# Patient Record
Sex: Male | Born: 1980 | Race: Black or African American | Hispanic: No | Marital: Single | Smoking: Current every day smoker
Health system: Southern US, Community
[De-identification: ages and names within clinical notes are randomized; demographics above are authoritative.]

## PROBLEM LIST (undated history)

## (undated) DIAGNOSIS — I319 Disease of pericardium, unspecified: Secondary | ICD-10-CM

## (undated) DIAGNOSIS — I10 Essential (primary) hypertension: Secondary | ICD-10-CM

## (undated) DIAGNOSIS — J189 Pneumonia, unspecified organism: Secondary | ICD-10-CM

## (undated) DIAGNOSIS — K219 Gastro-esophageal reflux disease without esophagitis: Secondary | ICD-10-CM

## (undated) DIAGNOSIS — J45909 Unspecified asthma, uncomplicated: Secondary | ICD-10-CM

## (undated) DIAGNOSIS — E119 Type 2 diabetes mellitus without complications: Secondary | ICD-10-CM

## (undated) HISTORY — PX: APPENDECTOMY: SHX54

## (undated) HISTORY — PX: TONSILLECTOMY: SUR1361

---

## 2016-09-28 ENCOUNTER — Encounter (HOSPITAL_COMMUNITY): Payer: Self-pay | Admitting: Vascular Surgery

## 2016-09-28 ENCOUNTER — Emergency Department (HOSPITAL_COMMUNITY)
Admission: EM | Admit: 2016-09-28 | Discharge: 2016-09-28 | Disposition: A | Discharge status: Home / Self Care | Payer: Self-pay | Attending: Dermatology | Admitting: Dermatology

## 2016-09-28 DIAGNOSIS — Z5321 Procedure and treatment not carried out due to patient leaving prior to being seen by health care provider: Secondary | ICD-10-CM | POA: Insufficient documentation

## 2016-09-28 DIAGNOSIS — R079 Chest pain, unspecified: Secondary | ICD-10-CM | POA: Insufficient documentation

## 2016-09-28 HISTORY — DX: Essential (primary) hypertension: I10

## 2016-09-28 HISTORY — DX: Disease of pericardium, unspecified: I31.9

## 2016-09-28 HISTORY — DX: Unspecified asthma, uncomplicated: J45.909
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
## 2016-09-28 NOTE — ED Notes (Addendum)
Pt refusing blood work at this time. RN encouraged patient to allow her to draw blood but pt continues to refuse

## 2017-11-30 ENCOUNTER — Encounter (HOSPITAL_COMMUNITY): Payer: Self-pay | Admitting: Emergency Medicine

## 2017-11-30 ENCOUNTER — Emergency Department (HOSPITAL_COMMUNITY): Payer: Self-pay

## 2017-11-30 ENCOUNTER — Other Ambulatory Visit: Payer: Self-pay

## 2017-11-30 ENCOUNTER — Emergency Department (HOSPITAL_COMMUNITY)
Admission: EM | Admit: 2017-11-30 | Discharge: 2017-11-30 | Disposition: A | Discharge status: Home / Self Care | Payer: Self-pay | Attending: Emergency Medicine | Admitting: Emergency Medicine

## 2017-11-30 DIAGNOSIS — R739 Hyperglycemia, unspecified: Secondary | ICD-10-CM

## 2017-11-30 DIAGNOSIS — F17200 Nicotine dependence, unspecified, uncomplicated: Secondary | ICD-10-CM | POA: Insufficient documentation

## 2017-11-30 DIAGNOSIS — J209 Acute bronchitis, unspecified: Secondary | ICD-10-CM | POA: Insufficient documentation

## 2017-11-30 DIAGNOSIS — I10 Essential (primary) hypertension: Secondary | ICD-10-CM | POA: Insufficient documentation

## 2017-11-30 LAB — BASIC METABOLIC PANEL
ANION GAP: 7 (ref 5–15)
BUN: 15 mg/dL (ref 6–20)
CO2: 26 mmol/L (ref 22–32)
Calcium: 8.3 mg/dL — ABNORMAL LOW (ref 8.9–10.3)
Chloride: 96 mmol/L — ABNORMAL LOW (ref 98–111)
Creatinine, Ser: 1.12 mg/dL (ref 0.61–1.24)
GLUCOSE: 511 mg/dL — AB (ref 70–99)
POTASSIUM: 3.8 mmol/L (ref 3.5–5.1)
Sodium: 129 mmol/L — ABNORMAL LOW (ref 135–145)

## 2017-11-30 LAB — GROUP A STREP BY PCR: Group A Strep by PCR: NOT DETECTED

## 2017-11-30 LAB — CBG MONITORING, ED
GLUCOSE-CAPILLARY: 287 mg/dL — AB (ref 70–99)
Glucose-Capillary: 540 mg/dL (ref 70–99)
Glucose-Capillary: >600 mg/dL (ref 70–99)

## 2017-11-30 MED ORDER — INSULIN ASPART 100 UNIT/ML ~~LOC~~ SOLN
5.0000 [IU] | Freq: Once | SUBCUTANEOUS | Status: AC
  Administered 2017-11-30: 5 [IU] via INTRAVENOUS
  Filled 2017-11-30: qty 1

## 2017-11-30 MED ORDER — ALBUTEROL SULFATE (2.5 MG/3ML) 0.083% IN NEBU: 5.0000 mg | INHALATION_SOLUTION | Freq: Once | RESPIRATORY_TRACT | Status: DC

## 2017-11-30 MED ORDER — AMOXICILLIN 500 MG PO CAPS: 500.0000 mg | ORAL_CAPSULE | Freq: Three times a day (TID) | ORAL | 0 refills | Status: DC

## 2017-11-30 MED ORDER — "INSULIN SYRINGE-NEEDLE U-100 28G X 1/2"" 1 ML MISC": 1.0000 | Freq: Two times a day (BID) | 0 refills | Status: DC

## 2017-11-30 MED ORDER — IPRATROPIUM-ALBUTEROL 0.5-2.5 (3) MG/3ML IN SOLN
RESPIRATORY_TRACT | Status: AC
  Filled 2017-11-30: qty 3

## 2017-11-30 MED ORDER — SODIUM CHLORIDE 0.9 % IV BOLUS
1000.0000 mL | Freq: Once | INTRAVENOUS | Status: AC
Start: 2017-11-30 — End: 2017-11-30
  Administered 2017-11-30: 1000 mL via INTRAVENOUS

## 2017-11-30 MED ORDER — IPRATROPIUM BROMIDE 0.02 % IN SOLN
0.5000 mg | Freq: Once | RESPIRATORY_TRACT | Status: DC
Start: 2017-11-30 — End: 2017-11-30

## 2017-11-30 MED ORDER — INSULIN ASPART 100 UNIT/ML ~~LOC~~ SOLN
SUBCUTANEOUS | Status: AC
  Filled 2017-11-30: qty 1

## 2017-11-30 MED ORDER — SODIUM CHLORIDE 0.9 % IV BOLUS
1000.0000 mL | Freq: Once | INTRAVENOUS | Status: AC
  Administered 2017-11-30: 1000 mL via INTRAVENOUS

## 2017-11-30 MED ORDER — INSULIN ASPART 100 UNIT/ML ~~LOC~~ SOLN
5.0000 [IU] | Freq: Once | SUBCUTANEOUS | Status: AC
  Administered 2017-11-30: 5 [IU] via INTRAVENOUS

## 2017-11-30 MED ORDER — ALBUTEROL SULFATE HFA 108 (90 BASE) MCG/ACT IN AERS
2.0000 | INHALATION_SPRAY | Freq: Four times a day (QID) | RESPIRATORY_TRACT | Status: DC | PRN
  Administered 2017-11-30: 2 via RESPIRATORY_TRACT
  Filled 2017-11-30: qty 6.7

## 2017-11-30 MED ORDER — INSULIN NPH ISOPHANE & REGULAR (70-30) 100 UNIT/ML ~~LOC~~ SUSP: 3.0000 [IU] | Freq: Two times a day (BID) | SUBCUTANEOUS | 0 refills | Status: AC

## 2017-11-30 MED ORDER — AEROCHAMBER Z-STAT PLUS/MEDIUM MISC
1.0000 | Freq: Once | Status: AC
  Administered 2017-11-30: 1
  Filled 2017-11-30: qty 1

## 2017-11-30 MED ORDER — ACETAMINOPHEN 500 MG PO TABS
1000.0000 mg | ORAL_TABLET | Freq: Once | ORAL | Status: AC
  Administered 2017-11-30: 1000 mg via ORAL
  Filled 2017-11-30: qty 2

## 2017-11-30 MED ORDER — IPRATROPIUM-ALBUTEROL 0.5-2.5 (3) MG/3ML IN SOLN
3.0000 mL | Freq: Once | RESPIRATORY_TRACT | Status: AC
  Administered 2017-11-30: 3 mL via RESPIRATORY_TRACT

## 2017-11-30 NOTE — ED Provider Notes (Signed)
[facility] EMERGENCY DEPARTMENT Provider Note   CSN: [id number] Arrival date & time: 11/30/17  0048  Time seen 01:35 AM   History   Chief Complaint Chief Complaint  Patient presents with  . Cough    HPI Jesse Lee is a 37 y.o. male.  HPI patient states 3 or 4 days ago he started with a sore throat and then he started getting a cough with dark green sputum production.  He states he feels short of breath and he has wheezing off and on.  He states he has had wheezing in the past and has used inhalers in the past but he does not have one currently to use.  He states yesterday morning his fever was 101.  He states he is having chills and hot flashes.  He denies rhinorrhea and states he did have nausea with one episode of vomiting on July 8.  No diarrhea.  He states he had a 60 pound weight loss in about 4 months due to his diabetes being out of control.  He states he has been on insulin for about a month since things seem to have stabilized however he ran out of his insulin 3 days ago.  PCP none, has first appt next week in [place]  Past Medical History:  Diagnosis Date  . Asthma   . Hypertension   . Pericarditis     There are no active problems to display for this patient.   Past Surgical History:  Procedure Laterality Date  . APPENDECTOMY    . TONSILLECTOMY          Home Medications     Insulin 70/30 3 units twice a day norvasc pravastatin  Prior to Admission medications   Medication Sig Start Date End Date Taking? Authorizing Provider  amoxicillin (AMOXIL) 500 MG capsule Take 1 capsule (500 mg total) by mouth 3 (three) times daily. 11/30/17   Rolland Porter, MD  insulin NPH-regular Human (NOVOLIN 70/30) (70-30) 100 UNIT/ML injection Inject 3 Units into the skin 2 (two) times daily with a meal. 11/30/17   Rolland Porter, MD  Insulin Syringe-Needle U-100 28G X 1/2" 1 ML MISC 1 Syringe by Does not apply route 2 (two) times daily. 11/30/17   Rolland Porter, MD    Family History No  family history on file.  Social History Social History   Tobacco Use  . Smoking status: Current Every Day Smoker  . Smokeless tobacco: Never Used  Substance Use Topics  . Alcohol use: Not on file  . Drug use: Not on file  employed   Allergies   Patient has no allergy information on record.   Review of Systems Review of Systems  All other systems reviewed and are negative.    Physical Exam Updated Vital Signs BP 111/67   Pulse 70   Temp 97.7 F (36.5 C) (Oral)   Resp 20   Wt 100.2 kg (221 lb)   SpO2 94%   Vital signs normal    Physical Exam  Constitutional: He is oriented to person, place, and time. He appears well-developed and well-nourished.  Non-toxic appearance. He does not appear ill. No distress.  HENT:  Head: Normocephalic and atraumatic.  Right Ear: External ear normal.  Left Ear: External ear normal.  Nose: Nose normal. No mucosal edema or rhinorrhea.  Mouth/Throat: Mucous membranes are normal. No dental abscesses or uvula swelling. Posterior oropharyngeal erythema present.  Patient's left ear canal is full of wax, I cannot see his TM.  Right  TM appears opaque without erythema.  The ear canal is relatively clean with minimal wax.  Eyes: Pupils are equal, round, and reactive to light. Conjunctivae and EOM are normal.  Neck: Normal range of motion and full passive range of motion without pain. Neck supple.  Cardiovascular: Normal rate, regular rhythm and normal heart sounds. Exam reveals no gallop and no friction rub.  No murmur heard. Pulmonary/Chest: Effort normal. No respiratory distress. He has decreased breath sounds. He has wheezes. He has no rhonchi. He has no rales. He exhibits no tenderness and no crepitus.  Abdominal: Soft. Normal appearance and bowel sounds are normal. He exhibits no distension. There is no tenderness. There is no rebound and no guarding.  Musculoskeletal: Normal range of motion. He exhibits no edema or tenderness.  Moves all  extremities well.   Lymphadenopathy:    He has cervical adenopathy.       Right cervical: Superficial cervical adenopathy present.       Left cervical: Superficial cervical adenopathy present.  Neurological: He is alert and oriented to person, place, and time. He has normal strength. No cranial nerve deficit.  Skin: Skin is warm, dry and intact. No rash noted. No erythema. No pallor.  Psychiatric: He has a normal mood and affect. His speech is normal and behavior is normal. His mood appears not anxious.  Nursing note and vitals reviewed.    ED Treatments / Results  Labs (all labs ordered are listed, but only abnormal results are displayed) Results for orders placed or performed during the hospital encounter of 11/30/17  Group A Strep by PCR  Result Value Ref Range   Group A Strep by PCR NOT DETECTED NOT DETECTED  Basic metabolic panel  Result Value Ref Range   Sodium 129 (L) 135 - 145 mmol/L   Potassium 3.8 3.5 - 5.1 mmol/L   Chloride 96 (L) 98 - 111 mmol/L   CO2 26 22 - 32 mmol/L   Glucose, Bld 511 (HH) 70 - 99 mg/dL   BUN 15 6 - 20 mg/dL   Creatinine, Ser 1.12 0.61 - 1.24 mg/dL   Calcium 8.3 (L) 8.9 - 10.3 mg/dL   GFR calc non Af Amer >60 >60 mL/min   GFR calc Af Amer >60 >60 mL/min   Anion gap 7 5 - 15  CBG monitoring, ED  Result Value Ref Range   Glucose-Capillary 540 (HH) 70 - 99 mg/dL   Comment 1 Notify RN   CBG monitoring, ED  Result Value Ref Range   Glucose-Capillary >600 (HH) 70 - 99 mg/dL  CBG monitoring, ED  Result Value Ref Range   Glucose-Capillary 287 (H) 70 - 99 mg/dL   Laboratory interpretation all normal    EKG None  Radiology Dg Chest 2 View  Result Date: 11/30/2017 CLINICAL DATA:  Cough and shortness of breath.  Chest pain. EXAM: CHEST - 2 VIEW COMPARISON:  None. FINDINGS: The cardiomediastinal contours are normal. The lungs are clear. Pulmonary vasculature is normal. No consolidation, pleural effusion, or pneumothorax. No acute osseous  abnormalities are seen. Remote right anterior rib fracture. IMPRESSION: Unremarkable radiographs of the chest. Electronically Signed   By: Jeb Levering M.D.   On: 11/30/2017 01:28    Procedures .Critical Care Performed by: Rolland Porter, MD Authorized by: Rolland Porter, MD   Critical care provider statement:    Critical care time (minutes):  31   Critical care was necessary to treat or prevent imminent or life-threatening deterioration of the following conditions:  Endocrine crisis   Critical care was time spent personally by me on the following activities:  Examination of patient, obtaining history from patient or surrogate, ordering and review of laboratory studies and re-evaluation of patient's condition   (including critical care time)  Medications Ordered in ED Medications  albuterol (PROVENTIL HFA;VENTOLIN HFA) 108 (90 Base) MCG/ACT inhaler 2 puff (has no administration in time range)  aerochamber Z-Stat Plus/medium 1 each (has no administration in time range)  ipratropium-albuterol (DUONEB) 0.5-2.5 (3) MG/3ML nebulizer solution 3 mL (3 mLs Nebulization Given 11/30/17 0210)  sodium chloride 0.9 % bolus 1,000 mL (0 mLs Intravenous Stopped 11/30/17 0325)  insulin aspart (novoLOG) injection 5 Units (5 Units Intravenous Given 11/30/17 0219)  acetaminophen (TYLENOL) tablet 1,000 mg (1,000 mg Oral Given 11/30/17 0247)  sodium chloride 0.9 % bolus 1,000 mL (1,000 mLs Intravenous New Bag/Given 11/30/17 0333)  insulin aspart (novoLOG) injection 5 Units (5 Units Intravenous Given 11/30/17 0333)     Initial Impression / Assessment and Plan / ED Course  I have reviewed the triage vital signs and the nursing notes.  Pertinent labs & imaging results that were available during my care of the patient were reviewed by me and considered in my medical decision making (see chart for details).    Patient was given albuterol nebulizer treatment for his diminished breath sounds and scattered  wheezing.   Patient CBG showed his blood sugar was over 540.  He had an IV inserted and he was given IV fluids and IV insulin 5 units.  Patient CBG was rechecked around 330 which is about 1 hour after he got the IV insulin.  His CBG now is reading greater than 600.  Patient states he did eat prior to coming to the ED.  I am still waiting for his be met to result he does not appear to be in DKA.  Patient's b met has resulted and he is hyperglycemic without metabolic acidosis.  Patient was given a second dose of IV insulin and another liter of fluid.  His CBG did improve to 287.  At this point he was felt stable for discharge.  Patient was given a prescription for insulin that he ran out of.  He was also given antibiotics because he is a smoker with bronchitis.  He was given an albuterol inhaler with a spacer to use for his bronchospasm.  He is encouraged to keep his first appointment with his new primary care doctor next week, he should return to the ED if he seems to be getting worse.   Final Clinical Impressions(s) / ED Diagnoses   Final diagnoses:  Hyperglycemia  Bronchitis with bronchospasm    ED Discharge Orders        Ordered    amoxicillin (AMOXIL) 500 MG capsule  3 times daily     11/30/17 0511    insulin NPH-regular Human (NOVOLIN 70/30) (70-30) 100 UNIT/ML injection  2 times daily with meals     11/30/17 0514    Insulin Syringe-Needle U-100 28G X 1/2" 1 ML MISC  2 times daily     11/30/17 0514      Plan discharge  Rolland Porter, MD, Barbette Or, MD 11/30/17 [phone]

## 2017-11-30 NOTE — Discharge Instructions (Addendum)
Keep your appointment next week with your new doctor. Restart your insulin twice a day. Look at the diet information. Take the antibiotic until gone. Use the inhaler as needed for wheezing or shortness of breath. Take mucinex DM OTC for cough. Recheck if you get a fever, struggle to breathe or seem worse. Try to stop smoking!!!

## 2017-11-30 NOTE — ED Triage Notes (Signed)
Pt C/O cough and SOB that started 2 days ago. Pt also states his throat has been sore.

## 2017-11-30 NOTE — ED Notes (Signed)
Pt given urinal.

## 2017-11-30 NOTE — ED Notes (Signed)
Critical result. BS 511. MD Lynelle DoctorKnapp notified.

## 2018-09-26 ENCOUNTER — Other Ambulatory Visit: Payer: Self-pay

## 2018-09-26 ENCOUNTER — Emergency Department (HOSPITAL_COMMUNITY): Payer: Self-pay

## 2018-09-26 ENCOUNTER — Emergency Department (HOSPITAL_COMMUNITY)
Admission: EM | Admit: 2018-09-26 | Discharge: 2018-09-26 | Disposition: A | Discharge status: Home / Self Care | Payer: Self-pay | Attending: Emergency Medicine | Admitting: Emergency Medicine

## 2018-09-26 DIAGNOSIS — F17200 Nicotine dependence, unspecified, uncomplicated: Secondary | ICD-10-CM | POA: Insufficient documentation

## 2018-09-26 DIAGNOSIS — Z794 Long term (current) use of insulin: Secondary | ICD-10-CM | POA: Insufficient documentation

## 2018-09-26 DIAGNOSIS — K5900 Constipation, unspecified: Secondary | ICD-10-CM | POA: Insufficient documentation

## 2018-09-26 DIAGNOSIS — R1011 Right upper quadrant pain: Secondary | ICD-10-CM

## 2018-09-26 DIAGNOSIS — R739 Hyperglycemia, unspecified: Secondary | ICD-10-CM | POA: Insufficient documentation

## 2018-09-26 DIAGNOSIS — I10 Essential (primary) hypertension: Secondary | ICD-10-CM | POA: Insufficient documentation

## 2018-09-26 DIAGNOSIS — J45909 Unspecified asthma, uncomplicated: Secondary | ICD-10-CM | POA: Insufficient documentation

## 2018-09-26 DIAGNOSIS — F329 Major depressive disorder, single episode, unspecified: Secondary | ICD-10-CM | POA: Insufficient documentation

## 2018-09-26 LAB — COMPREHENSIVE METABOLIC PANEL
ALT: 15 U/L (ref 0–44)
AST: 29 U/L (ref 15–41)
Albumin: 4.3 g/dL (ref 3.5–5.0)
Alkaline Phosphatase: 73 U/L (ref 38–126)
Anion gap: 11 (ref 5–15)
BUN: 16 mg/dL (ref 6–20)
CO2: 21 mmol/L — ABNORMAL LOW (ref 22–32)
Calcium: 9.1 mg/dL (ref 8.9–10.3)
Chloride: 100 mmol/L (ref 98–111)
Creatinine, Ser: 1.16 mg/dL (ref 0.61–1.24)
GFR calc Af Amer: >60 mL/min (ref >60)
GFR calc non Af Amer: >60 mL/min (ref >60)
Glucose, Bld: 387 mg/dL — ABNORMAL HIGH (ref 70–99)
Potassium: 4.9 mmol/L (ref 3.5–5.1)
Sodium: 132 mmol/L — ABNORMAL LOW (ref 135–145)
Total Bilirubin: 1.3 mg/dL — ABNORMAL HIGH (ref 0.3–1.2)
Total Protein: 7.4 g/dL (ref 6.5–8.1)

## 2018-09-26 LAB — CBC WITH DIFFERENTIAL/PLATELET
Abs Immature Granulocytes: 0.02 10*3/uL (ref 0.00–0.07)
Basophils Absolute: 0 10*3/uL (ref 0.0–0.1)
Basophils Relative: 0 %
Eosinophils Absolute: 0.2 10*3/uL (ref 0.0–0.5)
Eosinophils Relative: 2 %
HCT: 50.5 % (ref 39.0–52.0)
Hemoglobin: 18.2 g/dL — ABNORMAL HIGH (ref 13.0–17.0)
Immature Granulocytes: 0 %
Lymphocytes Relative: 20 %
Lymphs Abs: 1.4 10*3/uL (ref 0.7–4.0)
MCH: 31.9 pg (ref 26.0–34.0)
MCHC: 36 g/dL (ref 30.0–36.0)
MCV: 88.4 fL (ref 80.0–100.0)
Monocytes Absolute: 0.5 10*3/uL (ref 0.1–1.0)
Monocytes Relative: 7 %
Neutro Abs: 5.1 10*3/uL (ref 1.7–7.7)
Neutrophils Relative %: 71 %
Platelets: 252 10*3/uL (ref 150–400)
RBC: 5.71 MIL/uL (ref 4.22–5.81)
RDW: 12.7 % (ref 11.5–15.5)
WBC: 7.3 10*3/uL (ref 4.0–10.5)
nRBC: 0 % (ref 0.0–0.2)

## 2018-09-26 LAB — URINALYSIS, ROUTINE W REFLEX MICROSCOPIC
Bacteria, UA: NONE SEEN
Bilirubin Urine: NEGATIVE
Glucose, UA: >=500 mg/dL — AB
Ketones, ur: 20 mg/dL — AB
Leukocytes,Ua: NEGATIVE
Nitrite: NEGATIVE
Protein, ur: NEGATIVE mg/dL
Specific Gravity, Urine: 1.03 (ref 1.005–1.030)
pH: 5 (ref 5.0–8.0)

## 2018-09-26 LAB — LIPASE, BLOOD: Lipase: 51 U/L (ref 11–51)

## 2018-09-26 LAB — CBG MONITORING, ED
Glucose-Capillary: 381 mg/dL — ABNORMAL HIGH (ref 70–99)
Glucose-Capillary: 300 mg/dL — ABNORMAL HIGH (ref 70–99)

## 2018-09-26 MED ORDER — SODIUM CHLORIDE 0.9 % IV BOLUS
1000.0000 mL | Freq: Once | INTRAVENOUS | Status: AC
  Administered 2018-09-26: 1000 mL via INTRAVENOUS

## 2018-09-26 MED ORDER — MORPHINE SULFATE (PF) 4 MG/ML IV SOLN
4.0000 mg | Freq: Once | INTRAVENOUS | Status: AC
  Administered 2018-09-26: 4 mg via INTRAVENOUS
  Filled 2018-09-26: qty 1

## 2018-09-26 MED ORDER — POLYETHYLENE GLYCOL 3350 17 G PO PACK: 17.0000 g | PACK | Freq: Every day | ORAL | 0 refills | Status: DC

## 2018-09-26 MED ORDER — FAMOTIDINE 20 MG PO TABS: 20.0000 mg | ORAL_TABLET | Freq: Two times a day (BID) | ORAL | 0 refills | Status: DC

## 2018-09-26 MED ORDER — ONDANSETRON HCL 4 MG/2ML IJ SOLN
4.0000 mg | Freq: Once | INTRAMUSCULAR | Status: AC
  Administered 2018-09-26: 4 mg via INTRAVENOUS
  Filled 2018-09-26: qty 2

## 2018-09-26 NOTE — ED Notes (Signed)
Patient transported to Ultrasound 

## 2018-09-26 NOTE — ED Triage Notes (Signed)
Pt here for evaluation of abdominal pain since yesterday. Denies N/V/D. Tender to touch.

## 2018-09-26 NOTE — Discharge Instructions (Addendum)
Please take miralax and pepcid as directed.   Please utilize counseling resources provided.  Please follow up with your primary care provider within 5-7 days for re-evaluation of your symptoms.   Please return to the ER sooner if you have any new or worsening symptoms, or if you have any of the following symptoms:  Abdominal pain that does not go away.  You have a fever.  You keep throwing up (vomiting).  The pain is felt only in portions of the abdomen. Pain in the right side could possibly be appendicitis. In an adult, pain in the left lower portion of the abdomen could be colitis or diverticulitis.  You pass bloody or black tarry stools.  There is bright red blood in the stool.  The constipation stays for more than 4 days.  There is belly (abdominal) or rectal pain.  You do not seem to be getting better.  You have any questions or concerns.
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
## 2018-11-28 ENCOUNTER — Other Ambulatory Visit: Payer: Self-pay | Admitting: Neurological Surgery

## 2018-12-04 NOTE — Pre-Procedure Instructions (Signed)
[place] ([address] [place] [address] [place]) [place] [address] Phone: [phone] Fax: [phone]      Your procedure is scheduled on  12-08-18 friday  Report to [facility] Main Entrance "A" at  0745A.M., and check in at the Admitting office.  Call this number if you have problems the morning of surgery:  [phone]  Call [phone] if you have any questions prior to your surgery date Monday-Friday 8am-4pm    Remember:  Do not eat or drink after midnight the night before your surgery   Take these medicines the morning of surgery with A SIP OF WATER :none  7 days prior to surgery STOP taking any Aspirin (unless otherwise instructed by your surgeon), Aleve, Naproxen, Ibuprofen, Motrin, Advil, Goody's, BC's, all herbal medications, fish oil, and all vitamins.    WHAT DO I DO ABOUT MY DIABETES MEDICATION?   . THE DAY BEFORE SURGERY, take ______20_____ units of Insulin NPH-regular Human(NOVOLIN) for your morning dose. Take 10 units, for your evening dose.      . THE MORNING OF SURGERY, take ____10_________ units of Insulin NPH-regular Human(NOVOLIN) .    How to Manage Your Diabetes Before and After Surgery  Why is it important to control my blood sugar before and after surgery? . Improving blood sugar levels before and after surgery helps healing and can limit problems. . A way of improving blood sugar control is eating a healthy diet by: o  Eating less sugar and carbohydrates o  Increasing activity/exercise o  Talking with your doctor about reaching your blood sugar goals . High blood sugars (greater than 180 mg/dL) can raise your risk of infections and slow your recovery, so you will need to focus on controlling your diabetes during the weeks before surgery. . Make sure that the doctor who takes care of your diabetes knows about your planned surgery including the date and location.  How do I manage my blood sugar  before surgery? . Check your blood sugar at least 4 times a day, starting 2 days before surgery, to make sure that the level is not too high or low. o Check your blood sugar the morning of your surgery when you wake up and every 2 hours until you get to the Short Stay unit. . If your blood sugar is less than 70 mg/dL, you will need to treat for low blood sugar: o Do not take insulin. o Treat a low blood sugar (less than 70 mg/dL) with  cup of clear juice (cranberry or apple), 4 glucose tablets, OR glucose gel. o Recheck blood sugar in 15 minutes after treatment (to make sure it is greater than 70 mg/dL). If your blood sugar is not greater than 70 mg/dL on recheck, call [phone] for further instructions. . Report your blood sugar to the short stay nurse when you get to Short Stay.  . If you are admitted to the hospital after surgery: o Your blood sugar will be checked by the staff and you will probably be given insulin after surgery (instead of oral diabetes medicines) to make sure you have good blood sugar levels. o The goal for blood sugar control after surgery is 80-180 mg/dL.   The Morning of Surgery  Do not wear jewelry.  Do not wear lotions, powders, or colognes.    Men may shave face and neck.  Do not bring valuables to the hospital.  [facility] is not responsible for any  belongings or valuables.  If you are a smoker, DO NOT Smoke 24 hours prior to surgery IF you wear a CPAP at night please bring your mask, tubing, and machine the morning of surgery   Remember that you must have someone to transport you home after your surgery, and remain with you for 24 hours if you are discharged the same day.   Contacts, glasses, hearing aids, dentures or bridgework may not be worn into surgery.    Leave your suitcase in the car.  After surgery it may be brought to your room.  For patients admitted to the hospital, discharge time will be determined by your treatment team.  Patients  discharged the day of surgery will not be allowed to drive home.    Special instructions:   [facility]- Preparing For Surgery  Before surgery, you can play an important role. Because skin is not sterile, your skin needs to be as free of germs as possible. You can reduce the number of germs on your skin by washing with CHG (chlorahexidine gluconate) Soap before surgery.  CHG is an antiseptic cleaner which kills germs and bonds with the skin to continue killing germs even after washing.    Oral Hygiene is also important to reduce your risk of infection.  Remember - BRUSH YOUR TEETH THE MORNING OF SURGERY WITH YOUR REGULAR TOOTHPASTE  Please do not use if you have an allergy to CHG or antibacterial soaps. If your skin becomes reddened/irritated stop using the CHG.  Do not shave (including legs and underarms) for at least 48 hours prior to first CHG shower. It is OK to shave your face.  Please follow these instructions carefully.   1. Shower the NIGHT BEFORE SURGERY and the MORNING OF SURGERY with CHG Soap.   2. If you chose to wash your hair, wash your hair first as usual with your normal shampoo.  3. After you shampoo, rinse your hair and body thoroughly to remove the shampoo.  4. Use CHG as you would any other liquid soap. You can apply CHG directly to the skin and wash gently with a scrungie or a clean washcloth.   5. Apply the CHG Soap to your body ONLY FROM THE NECK DOWN.  Do not use on open wounds or open sores. Avoid contact with your eyes, ears, mouth and genitals (private parts). Wash Face and genitals (private parts)  with your normal soap.   6. Wash thoroughly, paying special attention to the area where your surgery will be performed.  7. Thoroughly rinse your body with warm water from the neck down.  8. DO NOT shower/wash with your normal soap after using and rinsing off the CHG Soap.  9. Pat yourself dry with a CLEAN TOWEL.  10. Wear CLEAN PAJAMAS to bed the night before  surgery, wear comfortable clothes the morning of surgery  11. Place CLEAN SHEETS on your bed the night of your first shower and DO NOT SLEEP WITH PETS.  Day of Surgery:  Do not apply any deodorants/lotions. Please shower the morning of surgery with the CHG soap  Please wear clean clothes to the hospital/surgery center.   Remember to brush your teeth WITH YOUR REGULAR TOOTHPASTE.   Please read over the fact sheets that you were given.

## 2018-12-05 ENCOUNTER — Encounter (HOSPITAL_COMMUNITY)
Admission: RE | Admit: 2018-12-05 | Discharge: 2018-12-05 | Disposition: A | Discharge status: Home / Self Care | Payer: Self-pay | Source: Ambulatory Visit | Attending: Neurological Surgery | Admitting: Neurological Surgery

## 2018-12-05 ENCOUNTER — Other Ambulatory Visit (HOSPITAL_COMMUNITY)
Admission: RE | Admit: 2018-12-05 | Discharge: 2018-12-05 | Disposition: A | Discharge status: Home / Self Care | Payer: HRSA Program | Source: Ambulatory Visit | Attending: Neurological Surgery | Admitting: Neurological Surgery

## 2018-12-05 ENCOUNTER — Encounter (HOSPITAL_COMMUNITY): Payer: Self-pay

## 2018-12-05 ENCOUNTER — Other Ambulatory Visit: Payer: Self-pay

## 2018-12-05 ENCOUNTER — Ambulatory Visit (HOSPITAL_COMMUNITY)
Admission: RE | Admit: 2018-12-05 | Discharge: 2018-12-05 | Disposition: A | Discharge status: Home / Self Care | Payer: Self-pay | Source: Ambulatory Visit | Attending: Neurological Surgery | Admitting: Neurological Surgery

## 2018-12-05 DIAGNOSIS — M5126 Other intervertebral disc displacement, lumbar region: Secondary | ICD-10-CM

## 2018-12-05 DIAGNOSIS — Z1159 Encounter for screening for other viral diseases: Secondary | ICD-10-CM | POA: Diagnosis not present

## 2018-12-05 HISTORY — DX: Type 2 diabetes mellitus without complications: E11.9

## 2018-12-05 HISTORY — DX: Gastro-esophageal reflux disease without esophagitis: K21.9

## 2018-12-05 HISTORY — DX: Pneumonia, unspecified organism: J18.9

## 2018-12-05 LAB — HEMOGLOBIN A1C
Hgb A1c MFr Bld: 11.1 % — ABNORMAL HIGH (ref 4.8–5.6)
Mean Plasma Glucose: 271.87 mg/dL

## 2018-12-05 LAB — COMPREHENSIVE METABOLIC PANEL
ALT: 18 U/L (ref 0–44)
AST: 17 U/L (ref 15–41)
Albumin: 4.4 g/dL (ref 3.5–5.0)
Alkaline Phosphatase: 65 U/L (ref 38–126)
Anion gap: 11 (ref 5–15)
BUN: 11 mg/dL (ref 6–20)
CO2: 23 mmol/L (ref 22–32)
Calcium: 9.6 mg/dL (ref 8.9–10.3)
Chloride: 101 mmol/L (ref 98–111)
Creatinine, Ser: 1.1 mg/dL (ref 0.61–1.24)
GFR calc Af Amer: >60 mL/min (ref >60)
GFR calc non Af Amer: >60 mL/min (ref >60)
Glucose, Bld: 367 mg/dL — ABNORMAL HIGH (ref 70–99)
Potassium: 4.3 mmol/L (ref 3.5–5.1)
Sodium: 135 mmol/L (ref 135–145)
Total Bilirubin: 0.8 mg/dL (ref 0.3–1.2)
Total Protein: 7.7 g/dL (ref 6.5–8.1)

## 2018-12-05 LAB — CBC WITH DIFFERENTIAL/PLATELET
Abs Immature Granulocytes: 0.03 10*3/uL (ref 0.00–0.07)
Basophils Absolute: 0 10*3/uL (ref 0.0–0.1)
Basophils Relative: 0 %
Eosinophils Absolute: 0.1 10*3/uL (ref 0.0–0.5)
Eosinophils Relative: 1 %
HCT: 49.2 % (ref 39.0–52.0)
Hemoglobin: 17.2 g/dL — ABNORMAL HIGH (ref 13.0–17.0)
Immature Granulocytes: 0 %
Lymphocytes Relative: 15 %
Lymphs Abs: 1.3 10*3/uL (ref 0.7–4.0)
MCH: 31.2 pg (ref 26.0–34.0)
MCHC: 35 g/dL (ref 30.0–36.0)
MCV: 89.1 fL (ref 80.0–100.0)
Monocytes Absolute: 0.6 10*3/uL (ref 0.1–1.0)
Monocytes Relative: 7 %
Neutro Abs: 6.5 10*3/uL (ref 1.7–7.7)
Neutrophils Relative %: 77 %
Platelets: 196 10*3/uL (ref 150–400)
RBC: 5.52 MIL/uL (ref 4.22–5.81)
RDW: 12.4 % (ref 11.5–15.5)
WBC: 8.5 10*3/uL (ref 4.0–10.5)
nRBC: 0 % (ref 0.0–0.2)

## 2018-12-05 LAB — PROTIME-INR
INR: 1 (ref 0.8–1.2)
Prothrombin Time: 12.6 seconds (ref 11.4–15.2)

## 2018-12-05 LAB — GLUCOSE, CAPILLARY: Glucose-Capillary: 320 mg/dL — ABNORMAL HIGH (ref 70–99)

## 2018-12-05 LAB — SURGICAL PCR SCREEN
MRSA, PCR: NEGATIVE
Staphylococcus aureus: NEGATIVE

## 2018-12-05 LAB — SARS CORONAVIRUS 2 (TAT 6-24 HRS): SARS Coronavirus 2: NEGATIVE

## 2018-12-05 NOTE — Progress Notes (Signed)
PCP - [facility] in [facility]  Chest x-ray - 12-05-18 EKG - 12-05-18  DM - type 2 Fasting Blood Sugar - 220s CBG at PAT appointment - 320 Pt stated he did not take insulin this morning.  James aware.   Anesthesia review: yes Pt stated he slept under a fan a few nights ago, and has since had chest pain, burning throat, and cough with phlem.  At PAT appointment, patient denies any symptoms.  Jeneen Rinks notified and visited with patient at PAT appointment.   Patient denies shortness of breath, fever, cough and chest pain at PAT appointment  Patient verbalized understanding of instructions that were given to them at the PAT appointment. Patient was also instructed that they will need to review over the PAT instructions again at home before surgery.

## 2018-12-08 ENCOUNTER — Other Ambulatory Visit (HOSPITAL_COMMUNITY)
Admission: RE | Admit: 2018-12-08 | Discharge: 2018-12-08 | Disposition: A | Discharge status: Home / Self Care | Payer: HRSA Program | Source: Ambulatory Visit | Attending: Neurological Surgery | Admitting: Neurological Surgery

## 2018-12-08 DIAGNOSIS — Z1159 Encounter for screening for other viral diseases: Secondary | ICD-10-CM | POA: Insufficient documentation

## 2018-12-08 LAB — SARS CORONAVIRUS 2 (TAT 6-24 HRS): SARS Coronavirus 2: NEGATIVE

## 2018-12-08 NOTE — Anesthesia Preprocedure Evaluation (Addendum)
Anesthesia Evaluation  Patient identified by MRN, date of birth, ID band Patient awake    Reviewed: Allergy & Precautions, H&P , NPO status , Patient's Chart, lab work & pertinent test results  Airway Mallampati: II  TM Distance: >3 FB Neck ROM: Full    Dental no notable dental hx. (+) Teeth Intact, Dental Advisory Given   Pulmonary asthma , Current Smoker,    Pulmonary exam normal breath sounds clear to auscultation       Cardiovascular hypertension,  Rhythm:Regular Rate:Normal     Neuro/Psych negative neurological ROS  negative psych ROS   GI/Hepatic Neg liver ROS, GERD  ,  Endo/Other  diabetes, Poorly Controlled, Type 1, Insulin Dependent  Renal/GU negative Renal ROS  negative genitourinary   Musculoskeletal   Abdominal   Peds  Hematology negative hematology ROS (+)   Anesthesia Other Findings   Reproductive/Obstetrics negative OB ROS                           Anesthesia Physical Anesthesia Plan  ASA: III  Anesthesia Plan: General   Post-op Pain Management:    Induction: Intravenous  PONV Risk Score and Plan: 2 and Ondansetron and Midazolam  Airway Management Planned: Oral ETT  Additional Equipment:   Intra-op Plan:   Post-operative Plan: Extubation in OR  Informed Consent: I have reviewed the patients History and Physical, chart, labs and discussed the procedure including the risks, benefits and alternatives for the proposed anesthesia with the patient or authorized representative who has indicated his/her understanding and acceptance.     Dental advisory given  Plan Discussed with: CRNA  Anesthesia Plan Comments: (Surgery originally scheduled 12/08/18. Uncontrolled IDDMII noted at preop with A1c 11.1. Dr. Ronnald Ramp notified and case was cancelled. Subsequently reposted 12/11/18. Ability to proceed will depend on DOS BG. )       Anesthesia Quick Evaluation

## 2018-12-11 ENCOUNTER — Other Ambulatory Visit: Payer: Self-pay

## 2018-12-11 ENCOUNTER — Ambulatory Visit (HOSPITAL_COMMUNITY): Payer: Self-pay | Admitting: Anesthesiology

## 2018-12-11 ENCOUNTER — Ambulatory Visit (HOSPITAL_COMMUNITY): Payer: Self-pay

## 2018-12-11 ENCOUNTER — Ambulatory Visit (HOSPITAL_COMMUNITY)
Admission: RE | Admit: 2018-12-11 | Discharge: 2018-12-12 | Disposition: A | Discharge status: Home / Self Care | Payer: Self-pay | Attending: Neurological Surgery | Admitting: Neurological Surgery

## 2018-12-11 ENCOUNTER — Encounter (HOSPITAL_COMMUNITY): Payer: Self-pay

## 2018-12-11 ENCOUNTER — Ambulatory Visit (HOSPITAL_COMMUNITY): Payer: Self-pay | Admitting: Physician Assistant

## 2018-12-11 ENCOUNTER — Encounter (HOSPITAL_COMMUNITY)
Admission: RE | Disposition: A | Discharge status: Home / Self Care | Payer: Self-pay | Source: Home / Self Care | Attending: Neurological Surgery

## 2018-12-11 DIAGNOSIS — Z794 Long term (current) use of insulin: Secondary | ICD-10-CM | POA: Insufficient documentation

## 2018-12-11 DIAGNOSIS — M5116 Intervertebral disc disorders with radiculopathy, lumbar region: Secondary | ICD-10-CM | POA: Insufficient documentation

## 2018-12-11 DIAGNOSIS — Z9889 Other specified postprocedural states: Secondary | ICD-10-CM

## 2018-12-11 DIAGNOSIS — M48061 Spinal stenosis, lumbar region without neurogenic claudication: Secondary | ICD-10-CM | POA: Insufficient documentation

## 2018-12-11 DIAGNOSIS — Z419 Encounter for procedure for purposes other than remedying health state, unspecified: Secondary | ICD-10-CM

## 2018-12-11 DIAGNOSIS — J45909 Unspecified asthma, uncomplicated: Secondary | ICD-10-CM | POA: Insufficient documentation

## 2018-12-11 DIAGNOSIS — F17210 Nicotine dependence, cigarettes, uncomplicated: Secondary | ICD-10-CM | POA: Insufficient documentation

## 2018-12-11 DIAGNOSIS — E119 Type 2 diabetes mellitus without complications: Secondary | ICD-10-CM | POA: Insufficient documentation

## 2018-12-11 DIAGNOSIS — I10 Essential (primary) hypertension: Secondary | ICD-10-CM | POA: Insufficient documentation

## 2018-12-11 HISTORY — PX: LUMBAR LAMINECTOMY/DECOMPRESSION MICRODISCECTOMY: SHX5026

## 2018-12-11 LAB — GLUCOSE, CAPILLARY
Glucose-Capillary: 123 mg/dL — ABNORMAL HIGH (ref 70–99)
Glucose-Capillary: 123 mg/dL — ABNORMAL HIGH (ref 70–99)
Glucose-Capillary: 199 mg/dL — ABNORMAL HIGH (ref 70–99)
Glucose-Capillary: 261 mg/dL — ABNORMAL HIGH (ref 70–99)
Glucose-Capillary: 412 mg/dL — ABNORMAL HIGH (ref 70–99)

## 2018-12-11 SURGERY — LUMBAR LAMINECTOMY/DECOMPRESSION MICRODISCECTOMY 1 LEVEL
Anesthesia: General | Site: Spine Lumbar | Laterality: Left

## 2018-12-11 MED ORDER — 0.9 % SODIUM CHLORIDE (POUR BTL) OPTIME
TOPICAL | Status: DC | PRN
  Administered 2018-12-11: 12:00:00 1000 mL

## 2018-12-11 MED ORDER — PROPOFOL 10 MG/ML IV BOLUS
INTRAVENOUS | Status: DC | PRN
  Administered 2018-12-11: 140 mg via INTRAVENOUS

## 2018-12-11 MED ORDER — CHLORHEXIDINE GLUCONATE CLOTH 2 % EX PADS: 6.0000 | MEDICATED_PAD | Freq: Once | CUTANEOUS | Status: DC

## 2018-12-11 MED ORDER — THROMBIN 5000 UNITS EX SOLR
CUTANEOUS | Status: DC | PRN
  Administered 2018-12-11 (×2): 5000 [IU] via TOPICAL

## 2018-12-11 MED ORDER — MIDAZOLAM HCL 2 MG/2ML IJ SOLN
INTRAMUSCULAR | Status: AC
  Filled 2018-12-11: qty 2

## 2018-12-11 MED ORDER — HYDROMORPHONE HCL 1 MG/ML IJ SOLN
0.2500 mg | INTRAMUSCULAR | Status: DC | PRN
  Administered 2018-12-11 (×2): 0.5 mg via INTRAVENOUS

## 2018-12-11 MED ORDER — SODIUM CHLORIDE 0.9 % IV SOLN: 250.0000 mL | INTRAVENOUS | Status: DC

## 2018-12-11 MED ORDER — MIDAZOLAM HCL 5 MG/5ML IJ SOLN
INTRAMUSCULAR | Status: DC | PRN
  Administered 2018-12-11: 2 mg via INTRAVENOUS

## 2018-12-11 MED ORDER — THROMBIN 5000 UNITS EX SOLR
CUTANEOUS | Status: AC
  Filled 2018-12-11: qty 10000

## 2018-12-11 MED ORDER — METHOCARBAMOL 1000 MG/10ML IJ SOLN
500.0000 mg | Freq: Four times a day (QID) | INTRAVENOUS | Status: DC | PRN
  Filled 2018-12-11: qty 5

## 2018-12-11 MED ORDER — PROPOFOL 10 MG/ML IV BOLUS
INTRAVENOUS | Status: AC
  Filled 2018-12-11: qty 20

## 2018-12-11 MED ORDER — HYDROMORPHONE HCL 1 MG/ML IJ SOLN
INTRAMUSCULAR | Status: AC
  Filled 2018-12-11: qty 1

## 2018-12-11 MED ORDER — INSULIN ASPART 100 UNIT/ML ~~LOC~~ SOLN
SUBCUTANEOUS | Status: AC
  Administered 2018-12-11: 3 [IU]
  Filled 2018-12-11: qty 1

## 2018-12-11 MED ORDER — ACETAMINOPHEN 325 MG PO TABS: 650.0000 mg | ORAL_TABLET | ORAL | Status: DC | PRN

## 2018-12-11 MED ORDER — CELECOXIB 200 MG PO CAPS
200.0000 mg | ORAL_CAPSULE | Freq: Two times a day (BID) | ORAL | Status: DC
  Administered 2018-12-11 (×2): 200 mg via ORAL
  Filled 2018-12-11 (×2): qty 1

## 2018-12-11 MED ORDER — METHOCARBAMOL 500 MG PO TABS
500.0000 mg | ORAL_TABLET | Freq: Four times a day (QID) | ORAL | Status: DC | PRN
  Administered 2018-12-11: 500 mg via ORAL
  Filled 2018-12-11 (×2): qty 1

## 2018-12-11 MED ORDER — INSULIN ASPART 100 UNIT/ML ~~LOC~~ SOLN
15.0000 [IU] | Freq: Once | SUBCUTANEOUS | Status: AC
  Administered 2018-12-11: 15 [IU] via SUBCUTANEOUS

## 2018-12-11 MED ORDER — SODIUM CHLORIDE 0.9 % IV SOLN
INTRAVENOUS | Status: DC | PRN
  Administered 2018-12-11: 12:00:00 500 mL

## 2018-12-11 MED ORDER — LACTATED RINGERS IV SOLN
INTRAVENOUS | Status: DC
  Administered 2018-12-11: 09:00:00 via INTRAVENOUS

## 2018-12-11 MED ORDER — BUPIVACAINE HCL (PF) 0.25 % IJ SOLN
INTRAMUSCULAR | Status: DC | PRN
  Administered 2018-12-11: 10 mL
  Administered 2018-12-11: 3 mL

## 2018-12-11 MED ORDER — HYDROCODONE-ACETAMINOPHEN 7.5-325 MG PO TABS
1.0000 | ORAL_TABLET | Freq: Four times a day (QID) | ORAL | Status: DC
  Administered 2018-12-11 (×2): 1 via ORAL
  Filled 2018-12-11 (×2): qty 1

## 2018-12-11 MED ORDER — ONDANSETRON HCL 4 MG PO TABS: 4.0000 mg | ORAL_TABLET | Freq: Four times a day (QID) | ORAL | Status: DC | PRN

## 2018-12-11 MED ORDER — MORPHINE SULFATE (PF) 2 MG/ML IV SOLN: 2.0000 mg | INTRAVENOUS | Status: DC | PRN

## 2018-12-11 MED ORDER — POTASSIUM CHLORIDE IN NACL 20-0.9 MEQ/L-% IV SOLN: INTRAVENOUS | Status: DC

## 2018-12-11 MED ORDER — ROCURONIUM BROMIDE 10 MG/ML (PF) SYRINGE
PREFILLED_SYRINGE | INTRAVENOUS | Status: AC
  Filled 2018-12-11: qty 10

## 2018-12-11 MED ORDER — PHENOL 1.4 % MT LIQD: 1.0000 | OROMUCOSAL | Status: DC | PRN

## 2018-12-11 MED ORDER — ONDANSETRON HCL 4 MG/2ML IJ SOLN
INTRAMUSCULAR | Status: AC
  Filled 2018-12-11: qty 2

## 2018-12-11 MED ORDER — SODIUM CHLORIDE 0.9% FLUSH: 3.0000 mL | INTRAVENOUS | Status: DC | PRN

## 2018-12-11 MED ORDER — THROMBIN 5000 UNITS EX SOLR
OROMUCOSAL | Status: DC | PRN
  Administered 2018-12-11: 12:00:00 5 mL via TOPICAL

## 2018-12-11 MED ORDER — SODIUM CHLORIDE 0.9% FLUSH
3.0000 mL | Freq: Two times a day (BID) | INTRAVENOUS | Status: DC
  Administered 2018-12-11: 3 mL via INTRAVENOUS

## 2018-12-11 MED ORDER — BUPIVACAINE HCL (PF) 0.25 % IJ SOLN
INTRAMUSCULAR | Status: AC
  Filled 2018-12-11: qty 30

## 2018-12-11 MED ORDER — ONDANSETRON HCL 4 MG/2ML IJ SOLN: 4.0000 mg | Freq: Four times a day (QID) | INTRAMUSCULAR | Status: DC | PRN

## 2018-12-11 MED ORDER — CEFAZOLIN SODIUM-DEXTROSE 2-4 GM/100ML-% IV SOLN
2.0000 g | Freq: Three times a day (TID) | INTRAVENOUS | Status: AC
  Administered 2018-12-11 – 2018-12-12 (×2): 2 g via INTRAVENOUS
  Filled 2018-12-11 (×2): qty 100

## 2018-12-11 MED ORDER — SENNA 8.6 MG PO TABS
1.0000 | ORAL_TABLET | Freq: Two times a day (BID) | ORAL | Status: DC
  Administered 2018-12-11 (×2): 8.6 mg via ORAL
  Filled 2018-12-11 (×2): qty 1

## 2018-12-11 MED ORDER — DEXAMETHASONE SODIUM PHOSPHATE 10 MG/ML IJ SOLN
INTRAMUSCULAR | Status: AC
  Filled 2018-12-11: qty 1

## 2018-12-11 MED ORDER — ACETAMINOPHEN 500 MG PO TABS
ORAL_TABLET | ORAL | Status: AC
  Filled 2018-12-11: qty 2

## 2018-12-11 MED ORDER — LIDOCAINE 2% (20 MG/ML) 5 ML SYRINGE
INTRAMUSCULAR | Status: DC | PRN
  Administered 2018-12-11: 60 mg via INTRAVENOUS

## 2018-12-11 MED ORDER — LIDOCAINE 2% (20 MG/ML) 5 ML SYRINGE
INTRAMUSCULAR | Status: AC
  Filled 2018-12-11: qty 5

## 2018-12-11 MED ORDER — SUFENTANIL CITRATE 50 MCG/ML IV SOLN
INTRAVENOUS | Status: AC
  Filled 2018-12-11: qty 1

## 2018-12-11 MED ORDER — INSULIN ASPART 100 UNIT/ML ~~LOC~~ SOLN
0.0000 [IU] | Freq: Three times a day (TID) | SUBCUTANEOUS | Status: DC
  Administered 2018-12-12: 8 [IU] via SUBCUTANEOUS

## 2018-12-11 MED ORDER — INSULIN ASPART 100 UNIT/ML ~~LOC~~ SOLN
0.0000 [IU] | Freq: Three times a day (TID) | SUBCUTANEOUS | Status: DC
  Administered 2018-12-11: 8 [IU] via SUBCUTANEOUS

## 2018-12-11 MED ORDER — ACETAMINOPHEN 500 MG PO TABS
1000.0000 mg | ORAL_TABLET | Freq: Once | ORAL | Status: AC
  Administered 2018-12-11: 09:00:00 1000 mg via ORAL

## 2018-12-11 MED ORDER — INSULIN ASPART 100 UNIT/ML ~~LOC~~ SOLN: 0.0000 [IU] | Freq: Every day | SUBCUTANEOUS | Status: DC

## 2018-12-11 MED ORDER — SUCCINYLCHOLINE CHLORIDE 200 MG/10ML IV SOSY
PREFILLED_SYRINGE | INTRAVENOUS | Status: DC | PRN
  Administered 2018-12-11: 100 mg via INTRAVENOUS

## 2018-12-11 MED ORDER — SUGAMMADEX SODIUM 200 MG/2ML IV SOLN
INTRAVENOUS | Status: DC | PRN
  Administered 2018-12-11: 200 mg via INTRAVENOUS

## 2018-12-11 MED ORDER — SUFENTANIL CITRATE 50 MCG/ML IV SOLN
INTRAVENOUS | Status: DC | PRN
  Administered 2018-12-11: 10 ug via INTRAVENOUS
  Administered 2018-12-11: 20 ug via INTRAVENOUS
  Administered 2018-12-11: 10 ug via INTRAVENOUS

## 2018-12-11 MED ORDER — MENTHOL 3 MG MT LOZG: 1.0000 | LOZENGE | OROMUCOSAL | Status: DC | PRN

## 2018-12-11 MED ORDER — HEMOSTATIC AGENTS (NO CHARGE) OPTIME
TOPICAL | Status: DC | PRN
  Administered 2018-12-11: 1 via TOPICAL

## 2018-12-11 MED ORDER — CEFAZOLIN SODIUM-DEXTROSE 2-4 GM/100ML-% IV SOLN
2.0000 g | INTRAVENOUS | Status: AC
  Administered 2018-12-11: 11:00:00 2 g via INTRAVENOUS
  Filled 2018-12-11: qty 100

## 2018-12-11 MED ORDER — ONDANSETRON HCL 4 MG/2ML IJ SOLN
INTRAMUSCULAR | Status: DC | PRN
  Administered 2018-12-11: 4 mg via INTRAVENOUS

## 2018-12-11 MED ORDER — DEXAMETHASONE SODIUM PHOSPHATE 10 MG/ML IJ SOLN
10.0000 mg | Freq: Once | INTRAMUSCULAR | Status: AC
  Administered 2018-12-11: 10 mg via INTRAVENOUS

## 2018-12-11 MED ORDER — ROCURONIUM BROMIDE 10 MG/ML (PF) SYRINGE
PREFILLED_SYRINGE | INTRAVENOUS | Status: DC | PRN
  Administered 2018-12-11: 50 mg via INTRAVENOUS

## 2018-12-11 MED ORDER — HYDROCODONE-ACETAMINOPHEN 5-325 MG PO TABS
1.0000 | ORAL_TABLET | ORAL | Status: DC | PRN
  Administered 2018-12-11 – 2018-12-12 (×3): 2 via ORAL
  Filled 2018-12-11 (×3): qty 2

## 2018-12-11 MED ORDER — ACETAMINOPHEN 650 MG RE SUPP: 650.0000 mg | RECTAL | Status: DC | PRN

## 2018-12-11 SURGICAL SUPPLY — 53 items
BAG DECANTER FOR FLEXI CONT (MISCELLANEOUS) ×3 IMPLANT
BENZOIN TINCTURE PRP APPL 2/3 (GAUZE/BANDAGES/DRESSINGS) ×3 IMPLANT
BUR MATCHSTICK NEURO 3.0 LAGG (BURR) ×3 IMPLANT
CANISTER SUCT 3000ML PPV (MISCELLANEOUS) ×3 IMPLANT
CARTRIDGE OIL MAESTRO DRILL (MISCELLANEOUS) ×1 IMPLANT
CLOSURE WOUND 1/2 X4 (GAUZE/BANDAGES/DRESSINGS) ×1
COVER WAND RF STERILE (DRAPES) ×3 IMPLANT
DERMABOND ADVANCED (GAUZE/BANDAGES/DRESSINGS) ×2
DERMABOND ADVANCED .7 DNX12 (GAUZE/BANDAGES/DRESSINGS) IMPLANT
DIFFUSER DRILL AIR PNEUMATIC (MISCELLANEOUS) ×3 IMPLANT
DRAPE LAPAROTOMY 100X72X124 (DRAPES) ×3 IMPLANT
DRAPE MICROSCOPE LEICA (MISCELLANEOUS) ×3 IMPLANT
DRAPE POUCH INSTRU U-SHP 10X18 (DRAPES) ×3 IMPLANT
DRAPE SURG 17X23 STRL (DRAPES) ×3 IMPLANT
DRSG OPSITE POSTOP 4X6 (GAUZE/BANDAGES/DRESSINGS) ×2 IMPLANT
DURAPREP 26ML APPLICATOR (WOUND CARE) ×3 IMPLANT
ELECT REM PT RETURN 9FT ADLT (ELECTROSURGICAL) ×3
ELECTRODE REM PT RTRN 9FT ADLT (ELECTROSURGICAL) ×1 IMPLANT
GLOVE BIO SURGEON STRL SZ7 (GLOVE) ×2 IMPLANT
GLOVE BIO SURGEON STRL SZ8 (GLOVE) ×3 IMPLANT
GLOVE BIOGEL PI IND STRL 6.5 (GLOVE) IMPLANT
GLOVE BIOGEL PI IND STRL 7.0 (GLOVE) IMPLANT
GLOVE BIOGEL PI IND STRL 7.5 (GLOVE) IMPLANT
GLOVE BIOGEL PI INDICATOR 6.5 (GLOVE) ×2
GLOVE BIOGEL PI INDICATOR 7.0 (GLOVE) ×2
GLOVE BIOGEL PI INDICATOR 7.5 (GLOVE) ×4
GLOVE SURG SS PI 6.0 STRL IVOR (GLOVE) ×2 IMPLANT
GOWN STRL REUS W/ TWL LRG LVL3 (GOWN DISPOSABLE) IMPLANT
GOWN STRL REUS W/ TWL XL LVL3 (GOWN DISPOSABLE) ×1 IMPLANT
GOWN STRL REUS W/TWL 2XL LVL3 (GOWN DISPOSABLE) ×2 IMPLANT
GOWN STRL REUS W/TWL LRG LVL3 (GOWN DISPOSABLE) ×2
GOWN STRL REUS W/TWL XL LVL3 (GOWN DISPOSABLE) ×2
HEMOSTAT POWDER KIT SURGIFOAM (HEMOSTASIS) ×2 IMPLANT
KIT BASIN OR (CUSTOM PROCEDURE TRAY) ×3 IMPLANT
KIT TURNOVER KIT B (KITS) ×3 IMPLANT
NDL HYPO 25X1 1.5 SAFETY (NEEDLE) ×1 IMPLANT
NDL SPNL 18GX3.5 QUINCKE PK (NEEDLE) IMPLANT
NEEDLE HYPO 25X1 1.5 SAFETY (NEEDLE) ×3 IMPLANT
NEEDLE SPNL 18GX3.5 QUINCKE PK (NEEDLE) ×3 IMPLANT
NS IRRIG 1000ML POUR BTL (IV SOLUTION) ×3 IMPLANT
OIL CARTRIDGE MAESTRO DRILL (MISCELLANEOUS) ×3
PACK LAMINECTOMY NEURO (CUSTOM PROCEDURE TRAY) ×3 IMPLANT
PAD ARMBOARD 7.5X6 YLW CONV (MISCELLANEOUS) ×9 IMPLANT
RUBBERBAND STERILE (MISCELLANEOUS) ×6 IMPLANT
SPONGE SURGIFOAM ABS GEL SZ50 (HEMOSTASIS) ×2 IMPLANT
STRIP CLOSURE SKIN 1/2X4 (GAUZE/BANDAGES/DRESSINGS) ×2 IMPLANT
SUT VIC AB 0 CT1 18XCR BRD8 (SUTURE) ×1 IMPLANT
SUT VIC AB 0 CT1 8-18 (SUTURE) ×2
SUT VIC AB 2-0 CP2 18 (SUTURE) ×3 IMPLANT
SUT VIC AB 3-0 SH 8-18 (SUTURE) ×3 IMPLANT
TOWEL GREEN STERILE (TOWEL DISPOSABLE) ×3 IMPLANT
TOWEL GREEN STERILE FF (TOWEL DISPOSABLE) ×3 IMPLANT
WATER STERILE IRR 1000ML POUR (IV SOLUTION) ×3 IMPLANT

## 2018-12-11 NOTE — H&P (Signed)
Subjective: Patient is a 38 y.o. male admitted for left leg pain. Onset of symptoms was several months ago, gradually worsening since that time.  The pain is rated severe, unremitting, and is located at the across the lower back and radiates to left leg. The pain is described as aching and occurs all day. The symptoms have been progressive. Symptoms are exacerbated by exercise. MRI or CT showed large extraforaminal disc herniation L4-5 left  Past Medical History:  Diagnosis Date  . Asthma   . Diabetes mellitus without complication ([place])    type 2  . GERD (gastroesophageal reflux disease)   . Hypertension   . Pericarditis   . Pneumonia     Past Surgical History:  Procedure Laterality Date  . APPENDECTOMY    . TONSILLECTOMY      Prior to Admission medications   Medication Sig Start Date End Date Taking? Authorizing Provider  insulin NPH-regular Human (NOVOLIN 70/30) (70-30) 100 UNIT/ML injection Inject 3 Units into the skin 2 (two) times daily with a meal. Patient taking differently: Inject 20 Units into the skin 3 (three) times daily.  11/30/17  Yes Rolland Porter, MD  Insulin Syringe-Needle U-100 28G X 1/2" 1 ML MISC 1 Syringe by Does not apply route 2 (two) times daily. 11/30/17  Yes Rolland Porter, MD  famotidine (PEPCID) 20 MG tablet Take 1 tablet (20 mg total) by mouth 2 (two) times daily for 14 days. Patient not taking: Reported on 11/29/2018 09/26/18 10/10/18  Couture, Cortni S, PA-C  polyethylene glycol (MIRALAX) 17 g packet Take 17 g by mouth daily. Dissolve one cap full in solution (water, gatorade, etc.) and administer once cap-full daily. You may titrate up daily by 1 cap-full until the patient is having pudding consistency of stools. After the patient is able to start passing softer stools they will need to be on 1/2 cap-full daily for 2 weeks. Patient not taking: Reported on 11/29/2018 09/26/18   Couture, Cortni S, PA-C   No Known Allergies  Social History   Tobacco Use  . Smoking status:  Current Every Day Smoker    Years: 1.00    Types: Cigarettes  . Smokeless tobacco: Never Used  Substance Use Topics  . Alcohol use: Yes    Alcohol/week: 5.0 standard drinks    Types: 5 Standard drinks or equivalent per week    History reviewed. No pertinent family history.   Review of Systems  Positive ROS: Negative  All other systems have been reviewed and were otherwise negative with the exception of those mentioned in the HPI and as above.  Objective: Vital signs in last 24 hours: Temp:  [98.5 F (36.9 C)] 98.5 F (36.9 C) (07/20 0821) Pulse Rate:  [73] 73 (07/20 0821) Resp:  [20] 20 (07/20 0821) BP: (143)/(96) 143/96 (07/20 0821) SpO2:  [97 %] 97 % (07/20 0821) Weight:  [108.6 kg] 108.6 kg (07/20 0827)  General Appearance: Alert, cooperative, no distress, appears stated age Head: Normocephalic, without obvious abnormality, atraumatic Eyes: PERRL, conjunctiva/corneas clear, EOM's intact    Neck: Supple, symmetrical, trachea midline Back: Symmetric, no curvature, ROM normal, no CVA tenderness Lungs:  respirations unlabored Heart: Regular rate and rhythm Abdomen: Soft, non-tender Extremities: Extremities normal, atraumatic, no cyanosis or edema Pulses: 2+ and symmetric all extremities Skin: Skin color, texture, turgor normal, no rashes or lesions  NEUROLOGIC:   Mental status: Alert and oriented x4,  no aphasia, good attention span, fund of knowledge, and memory Motor Exam - grossly normal Sensory Exam -  grossly normal Reflexes: Trace Coordination - grossly normal Gait - grossly normal Balance - grossly normal Cranial Nerves: I: smell Not tested  II: visual acuity  OS: nl    OD: nl  II: visual fields Full to confrontation  II: pupils Equal, round, reactive to light  III,VII: ptosis None  III,IV,VI: extraocular muscles  Full ROM  V: mastication Normal  V: facial light touch sensation  Normal  V,VII: corneal reflex  Present  VII: facial muscle function - upper   Normal  VII: facial muscle function - lower Normal  VIII: hearing Not tested  IX: soft palate elevation  Normal  IX,X: gag reflex Present  XI: trapezius strength  5/5  XI: sternocleidomastoid strength 5/5  XI: neck flexion strength  5/5  XII: tongue strength  Normal    Data Review Lab Results  Component Value Date   WBC 8.5 12/05/2018   HGB 17.2 (H) 12/05/2018   HCT 49.2 12/05/2018   MCV 89.1 12/05/2018   PLT 196 12/05/2018   Lab Results  Component Value Date   NA 135 12/05/2018   K 4.3 12/05/2018   CL 101 12/05/2018   CO2 23 12/05/2018   BUN 11 12/05/2018   CREATININE 1.10 12/05/2018   GLUCOSE 367 (H) 12/05/2018   Lab Results  Component Value Date   INR 1.0 12/05/2018    Assessment/Plan:  Estimated body mass index is 29.94 kg/m as calculated from the following:   Height as of this encounter: 6\' 3"  (1.905 m).   Weight as of this encounter: 108.6 kg. Patient admitted for left L4-5 extraforaminal microdiscectomy. Patient has failed a reasonable attempt at conservative therapy.  I explained the condition and procedure to the patient and answered any questions.  Patient wishes to proceed with procedure as planned. Understands risks/ benefits and typical outcomes of procedure.   Tia AlertDavid S Jones 12/11/2018 10:54 AM

## 2018-12-11 NOTE — Op Note (Signed)
12/11/2018  1:14 PM  PATIENT:  Jesse Lee  38 y.o. 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  BLOOD ADMINISTERED: none  DRAINS: None  SPECIMEN:  none  INDICATION FOR PROCEDURE: This patient presented with severe left leg pain. Imaging showed left foraminal stenosis at L4-5. The patient tried conservative measures without relief. Pain was debilitating. Recommended left L4-5 extraforaminal decompression and discectomy. Patient understood the risks, benefits, and alternatives and potential outcomes and wished to proceed.  PROCEDURE DETAILS: The patient was taken to the operating room and after induction of adequate generalized endotracheal anesthesia, the patient was rolled into the prone position on the Wilson frame and all pressure points were padded. The lumbar region was cleaned and then prepped with DuraPrep and draped in the usual sterile fashion. 5 cc of local anesthesia was injected and then a dorsal midline incision was made and carried down to the lumbo sacral fascia. The fascia was opened and the paraspinous musculature was taken down in a subperiosteal fashion to expose the extraforaminal space at L4-5 on the left (1 level above the L5-S1 transitional segment, the first tall disc space). Intraoperative x-ray confirmed my level, and then I used a combination of the high-speed drill and the Kerrison punches to perform an extraforaminal decompression and foraminotomy at L4-5 on the left. The underlying yellow ligament was opened and removed in a piecemeal fashion to expose the underlying exiting nerve root. I dissected down until I was medial to and  distal to the pedicle. The nerve root was well decompressed. We then gently retracted the nerve root superiorly with a retractor, coagulated the epidural venous vasculature, and incised the disc space. I performed a thorough intradiscal discectomy with pituitary rongeurs, until I had a nice decompression of the nerve root and the midline. I then palpated with a coronary dilator along the nerve root and into the foramen to assure adequate decompression. I felt no more compression of the nerve root. I irrigated with saline solution containing bacitracin. Achieved hemostasis with bipolar cautery, and then closed the fascia with 0 Vicryl. I closed the subcutaneous tissues with 2-0 Vicryl and the subcuticular tissues with 3-0 Vicryl. The skin was then closed with benzoin and Steri-Strips. The drapes were removed, a sterile dressing was applied. The patient was awakened from general anesthesia and transferred to the recovery room in stable condition. At the end of the procedure all sponge, needle and instrument counts were correct.    PLAN OF CARE: Admit for overnight observation  PATIENT DISPOSITION:  PACU - hemodynamically stable.   Delay start of Pharmacological VTE agent (>24hrs) due to surgical blood loss or risk of bleeding:  yes

## 2018-12-11 NOTE — Transfer of Care (Signed)
Immediate Anesthesia Transfer of Care Note  Patient: Jesse Lee  Procedure(s) Performed: Left Lumbar Four-Five Extraforaminal Microdiscectomy (Left Spine Lumbar)  Patient Location: PACU  Anesthesia Type:General  Level of Consciousness: drowsy and patient cooperative  Airway & Oxygen Therapy: Patient Spontanous Breathing and Patient connected to nasal cannula oxygen  Post-op Assessment: Report given to RN, Post -op Vital signs reviewed and stable and Patient moving all extremities  Post vital signs: Reviewed and stable  Last Vitals:  Vitals Value Taken Time  BP 138/99 12/11/18 1320  Temp    Pulse 81 12/11/18 1320  Resp 10 12/11/18 1320  SpO2 100 % 12/11/18 1320  Vitals shown include unvalidated device data.  Last Pain:  Vitals:   12/11/18 0841  PainSc: 0-No pain      Patients Stated Pain Goal: 2 (16/10/96 0454)  Complications: No apparent anesthesia complications

## 2018-12-11 NOTE — Anesthesia Postprocedure Evaluation (Signed)
Anesthesia Post Note  Patient: Jesse Lee  Procedure(s) Performed: Left Lumbar Four-Five Extraforaminal Microdiscectomy (Left Spine Lumbar)     Patient location during evaluation: PACU Anesthesia Type: General Level of consciousness: awake and alert Pain management: pain level controlled Vital Signs Assessment: post-procedure vital signs reviewed and stable Respiratory status: spontaneous breathing, nonlabored ventilation and respiratory function stable Cardiovascular status: blood pressure returned to baseline and stable Postop Assessment: no apparent nausea or vomiting Anesthetic complications: no    Last Vitals:  Vitals:   12/11/18 1405 12/11/18 1439  BP: (!) 132/99 134/89  Pulse: 85 79  Resp: 13 16  Temp:  36.4 C  SpO2: 93% 96%    Last Pain:  Vitals:   12/11/18 1439  TempSrc: Oral  PainSc:                  Deva Ron,W. EDMOND

## 2018-12-11 NOTE — Anesthesia Procedure Notes (Signed)
Procedure Name: Intubation Date/Time: 12/11/2018 11:24 AM Performed by: Moshe Salisbury, CRNA Pre-anesthesia Checklist: Patient identified, Emergency Drugs available, Suction available and Patient being monitored Patient Re-evaluated:Patient Re-evaluated prior to induction Oxygen Delivery Method: Circle System Utilized Preoxygenation: Pre-oxygenation with 100% oxygen Induction Type: IV induction Ventilation: Mask ventilation without difficulty Laryngoscope Size: Mac and 4 Grade View: Grade II Tube type: Oral Tube size: 8.0 mm Number of attempts: 1 Airway Equipment and Method: Stylet Placement Confirmation: ETT inserted through vocal cords under direct vision,  positive ETCO2 and breath sounds checked- equal and bilateral Secured at: 24 cm Tube secured with: Tape Dental Injury: Teeth and Oropharynx as per pre-operative assessment

## 2018-12-12 ENCOUNTER — Encounter (HOSPITAL_COMMUNITY): Payer: Self-pay | Admitting: Neurological Surgery

## 2018-12-12 ENCOUNTER — Emergency Department (HOSPITAL_COMMUNITY)
Admission: EM | Admit: 2018-12-12 | Discharge: 2018-12-12 | Disposition: A | Discharge status: Home / Self Care | Payer: Self-pay | Attending: Emergency Medicine | Admitting: Emergency Medicine

## 2018-12-12 ENCOUNTER — Other Ambulatory Visit: Payer: Self-pay

## 2018-12-12 DIAGNOSIS — J45909 Unspecified asthma, uncomplicated: Secondary | ICD-10-CM | POA: Insufficient documentation

## 2018-12-12 DIAGNOSIS — Z794 Long term (current) use of insulin: Secondary | ICD-10-CM | POA: Insufficient documentation

## 2018-12-12 DIAGNOSIS — F17210 Nicotine dependence, cigarettes, uncomplicated: Secondary | ICD-10-CM | POA: Insufficient documentation

## 2018-12-12 DIAGNOSIS — M5442 Lumbago with sciatica, left side: Secondary | ICD-10-CM

## 2018-12-12 DIAGNOSIS — E1165 Type 2 diabetes mellitus with hyperglycemia: Secondary | ICD-10-CM | POA: Insufficient documentation

## 2018-12-12 DIAGNOSIS — I10 Essential (primary) hypertension: Secondary | ICD-10-CM | POA: Insufficient documentation

## 2018-12-12 DIAGNOSIS — R739 Hyperglycemia, unspecified: Secondary | ICD-10-CM

## 2018-12-12 LAB — CBC WITH DIFFERENTIAL/PLATELET
Abs Immature Granulocytes: 0.04 10*3/uL (ref 0.00–0.07)
Basophils Absolute: 0 10*3/uL (ref 0.0–0.1)
Basophils Relative: 0 %
Eosinophils Absolute: 0.1 10*3/uL (ref 0.0–0.5)
Eosinophils Relative: 1 %
HCT: 43.9 % (ref 39.0–52.0)
Hemoglobin: 15.1 g/dL (ref 13.0–17.0)
Immature Granulocytes: 0 %
Lymphocytes Relative: 14 %
Lymphs Abs: 1.7 10*3/uL (ref 0.7–4.0)
MCH: 30.7 pg (ref 26.0–34.0)
MCHC: 34.4 g/dL (ref 30.0–36.0)
MCV: 89.2 fL (ref 80.0–100.0)
Monocytes Absolute: 1 10*3/uL (ref 0.1–1.0)
Monocytes Relative: 8 %
Neutro Abs: 9.3 10*3/uL — ABNORMAL HIGH (ref 1.7–7.7)
Neutrophils Relative %: 77 %
Platelets: 207 10*3/uL (ref 150–400)
RBC: 4.92 MIL/uL (ref 4.22–5.81)
RDW: 12.8 % (ref 11.5–15.5)
WBC: 12.2 10*3/uL — ABNORMAL HIGH (ref 4.0–10.5)
nRBC: 0 % (ref 0.0–0.2)

## 2018-12-12 LAB — GLUCOSE, CAPILLARY: Glucose-Capillary: 265 mg/dL — ABNORMAL HIGH (ref 70–99)

## 2018-12-12 LAB — BASIC METABOLIC PANEL
Anion gap: 11 (ref 5–15)
BUN: 16 mg/dL (ref 6–20)
CO2: 24 mmol/L (ref 22–32)
Calcium: 9 mg/dL (ref 8.9–10.3)
Chloride: 101 mmol/L (ref 98–111)
Creatinine, Ser: 1.03 mg/dL (ref 0.61–1.24)
GFR calc Af Amer: >60 mL/min (ref >60)
GFR calc non Af Amer: >60 mL/min (ref >60)
Glucose, Bld: 272 mg/dL — ABNORMAL HIGH (ref 70–99)
Potassium: 3.7 mmol/L (ref 3.5–5.1)
Sodium: 136 mmol/L (ref 135–145)

## 2018-12-12 LAB — CBG MONITORING, ED: Glucose-Capillary: 322 mg/dL — ABNORMAL HIGH (ref 70–99)

## 2018-12-12 MED ORDER — SODIUM CHLORIDE 0.9 % IV BOLUS
1000.0000 mL | Freq: Once | INTRAVENOUS | Status: AC
  Administered 2018-12-12: 1000 mL via INTRAVENOUS

## 2018-12-12 MED ORDER — HYDROCODONE-ACETAMINOPHEN 5-325 MG PO TABS: 1.0000 | ORAL_TABLET | Freq: Four times a day (QID) | ORAL | 0 refills | Status: DC | PRN

## 2018-12-12 MED ORDER — METHOCARBAMOL 500 MG PO TABS: 500.0000 mg | ORAL_TABLET | Freq: Four times a day (QID) | ORAL | 0 refills | Status: DC | PRN

## 2018-12-12 MED ORDER — ONDANSETRON HCL 4 MG/2ML IJ SOLN
4.0000 mg | Freq: Once | INTRAMUSCULAR | Status: AC
  Administered 2018-12-12: 21:00:00 4 mg via INTRAVENOUS
  Filled 2018-12-12: qty 2

## 2018-12-12 MED ORDER — HYDROMORPHONE HCL 1 MG/ML IJ SOLN
1.0000 mg | Freq: Once | INTRAMUSCULAR | Status: AC
  Administered 2018-12-12: 1 mg via INTRAVENOUS
  Filled 2018-12-12: qty 1

## 2018-12-12 NOTE — Discharge Instructions (Signed)
Wound Care Keep incision covered and dry for one week.  If you shower prior to then, cover incision with plastic wrap.  You may remove outer bandage after one week and shower.  Do not put any creams, lotions, or ointments on incision. Leave steri-strips on neck.  They will fall off by themselves. Activity Walk each and every day, increasing distance each day. No lifting greater than 5 lbs.  Avoid bending, arching, or twisting. No driving for 2 weeks; may ride as a passenger locally. If provided with back brace, wear when out of bed.  It is not necessary to wear in bed. Diet Resume your normal diet.  Return to Work Will be discussed at you follow up appointment. Call Your Doctor If Any of These Occur Redness, drainage, or swelling at the wound.  Temperature greater than 101 degrees. Severe pain not relieved by pain medication. Incision starts to come apart. Follow Up Appt Call today for appointment in 1-2 weeks [phone] or for problems.  If you have any hardware placed in your spine, you will need an x-ray before your appointment.   Microdiskectomy, Care After This sheet gives you information about how to care for yourself after your procedure. Your doctor may also give you more specific instructions. If you have problems or questions, call your doctor. What can I expect after the procedure? After the procedure, it is common to have:  Pain and stiffness in your back.  Some redness, swelling, and pain around your cut from surgery (incision). Follow these instructions at home: Medicines  Take over-the-counter and prescription medicines only as told by your doctor.  Ask your doctor if the medicine prescribed to you: ? Requires you to avoid driving or using heavy machinery. ? Can cause trouble pooping (constipation). You may need to take these actions to prevent or treat trouble pooping:  Drink enough fluid to keep your pee (urine) pale yellow.  Take over-the-counter or  prescription medicines.  Eat foods that are high in fiber. These include beans, whole grains, and fresh fruits and vegetables.  Limit foods that are high in fat and sugar. These include fried or sweet foods. Surgical cut care   Follow instructions from your doctor about how to take care of your cut from surgery. Make sure you: ? Wash your hands with soap and water before and after you change your bandage (dressing). If you cannot use soap and water, use hand sanitizer. ? Change your bandage as told by your doctor. ? Leave stitches (sutures), skin glue, or skin tape (adhesive) strips in place. They may need to stay in place for 2 weeks or longer. If tape strips get loose and curl up, you may trim the loose edges. Do not remove tape strips completely unless your doctor says it is okay.  Keep your cut from surgery clean and dry.  Check the area around your cut from surgery every day for signs of infection. Check for: ? More redness, swelling, or pain. ? Fluid or blood. ? Warmth. ? Pus or a bad smell. Managing pain, stiffness, and swelling   If told, put ice on the painful area. ? Put ice in a plastic bag. ? Place a towel between your skin and the bag. ? Leave the ice on for 20 minutes, 2-3 times per day. Activity  Rest as told by your doctor.  Do not sit or lie down for a long time without moving. Get up to take short walks every 1-2 hours. This is important.  Ask for help if you feel weak or unsteady.  Return to your normal activities as told by your doctor. Ask your doctor what activities are safe for you.  Do not lift anything that is heavier than 10 lb (4.5 kg), or the limit that you are told, until your doctor says that it is safe. Do not lift anything over your head.  Do not twist or bend at the waist until your doctor says it is okay.  Avoid pushing and pulling motions.  Ask your doctor what kinds of exercise you can do to make your back stronger. Do not start to exercise  until your doctor says it is okay. General instructions  Do not use any products that contain nicotine or tobacco, such as cigarettes, e-cigarettes, and chewing tobacco. These can delay healing. If you need help quitting, ask your doctor.  Do not take baths, swim, or use a hot tub until your doctor approves. Ask your doctor if you may take showers. You may only be allowed to take sponge baths.  Wear compression stockings as told by your doctor.  Keep all follow-up visits as told by your doctor or physical therapist. This is important. Contact a doctor if:  Your pain gets worse.  Your medicine does not help your pain.  You start to have pain or swelling in your legs or feet.  Your cut from surgery is more red, swollen, or painful.  You have fluid, blood, pus, or a bad smell coming from your cut from surgery.  You feel sick to your stomach (nauseous).  You vomit.  You have trouble controlling when you pee (urinate) or poop (have a bowel movement).  You have a fever. Get help right away if:  Your pain is very bad.  You have trouble breathing.  You start to have a cough.  You have chest pain. These symptoms may be an emergency. Do not wait to see if the symptoms will go away. Get medical help right away. Call your local emergency services (911 in the [place] Do not drive yourself to the hospital. Summary  After your procedure, it is common to have pain and stiffness in your back.  You may also have some redness, swelling, and pain around your cut from surgery.  Take over-the-counter and prescription medicines only as told by your doctor.  Follow instructions from your doctor about how to take care of your cut from surgery.  Keep all follow-up visits as told by your doctor or physical therapist. This is important. This information is not intended to replace advice given to you by your health care provider. Make sure you discuss any questions you have with your health care  provider. Document Released: 09/03/2010 Document Revised: 06/20/2018 Document Reviewed: 06/20/2018 Elsevier Patient Education  2020 [employer].

## 2018-12-12 NOTE — ED Provider Notes (Addendum)
[facility] EMERGENCY DEPARTMENT Provider Note   CSN: [id number] Arrival date & time: 12/12/18  1937    History   Chief Complaint No chief complaint on file.   HPI Jesse Lee is a 38 y.o. male.     Status post L4-5 extraforaminal decompression and microdiscectomy yesterday at [facility] via Dr. Sherley Bounds.  Patient discharged today.  Now with persistent low back pain with radiation to left lower leg.  These are similar symptoms to his preoperative condition.  No bowel or bladder incontinence.  He has taken Vicodin and Robaxin with minimal relief.  Positioning and palpation make pain worse.     Past Medical History:  Diagnosis Date  . Asthma   . Diabetes mellitus without complication ([place])    type 2  . GERD (gastroesophageal reflux disease)   . Hypertension   . Pericarditis   . Pneumonia     Patient Active Problem List   Diagnosis Date Noted  . S/P lumbar laminectomy 12/11/2018    Past Surgical History:  Procedure Laterality Date  . APPENDECTOMY    . LUMBAR LAMINECTOMY/DECOMPRESSION MICRODISCECTOMY Left 12/11/2018   Procedure: Left Lumbar Four-Five Extraforaminal Microdiscectomy;  Surgeon: Eustace Moore, MD;  Location: [place];  Service: Neurosurgery;  Laterality: Left;  . TONSILLECTOMY          Home Medications    Prior to Admission medications   Medication Sig Start Date End Date Taking? Authorizing Provider  HYDROcodone-acetaminophen (NORCO/VICODIN) 5-325 MG tablet Take 1-2 tablets by mouth every 6 (six) hours as needed for moderate pain or severe pain. 12/12/18  Yes Meyran, Ocie Cornfield, NP  insulin NPH-regular Human (NOVOLIN 70/30) (70-30) 100 UNIT/ML injection Inject 3 Units into the skin 2 (two) times daily with a meal. Patient taking differently: Inject 20 Units into the skin 3 (three) times daily.  11/30/17  Yes Rolland Porter, MD  methocarbamol (ROBAXIN) 500 MG tablet Take 1 tablet (500 mg total) by mouth every 6 (six) hours as needed for  muscle spasms. 12/12/18  Yes Meyran, Ocie Cornfield, NP    Family History History reviewed. No pertinent family history.  Social History Social History   Tobacco Use  . Smoking status: Current Every Day Smoker    Years: 1.00    Types: Cigarettes  . Smokeless tobacco: Never Used  Substance Use Topics  . Alcohol use: Yes    Alcohol/week: 5.0 standard drinks    Types: 5 Standard drinks or equivalent per week  . Drug use: Yes    Types: Marijuana     Allergies   Patient has no known allergies.   Review of Systems Review of Systems  All other systems reviewed and are negative.    Physical Exam Updated Vital Signs BP (!) 136/92 (BP Location: Right Arm)   Pulse 90   Temp 98.2 F (36.8 C) (Oral)   Resp 17   Ht 6\' 3"  (1.905 m)   Wt 108.4 kg   SpO2 96%   BMI 29.87 kg/m   Physical Exam Vitals signs and nursing note reviewed.  Constitutional:      Appearance: He is well-developed.     Comments: In pain  HENT:     Head: Normocephalic and atraumatic.  Eyes:     Conjunctiva/sclera: Conjunctivae normal.  Neck:     Musculoskeletal: Neck supple.  Cardiovascular:     Rate and Rhythm: Normal rate and regular rhythm.  Pulmonary:     Effort: Pulmonary effort is normal.  Breath sounds: Normal breath sounds.  Abdominal:     General: Bowel sounds are normal.     Palpations: Abdomen is soft.  Musculoskeletal:     Comments: Lower back: No obvious erythema or edema.  Skin:    General: Skin is warm and dry.  Neurological:     Mental Status: He is alert and oriented to person, place, and time.     Comments: Pain with straight leg raise left greater than right.  Psychiatric:        Behavior: Behavior normal.      ED Treatments / Results  Labs (all labs ordered are listed, but only abnormal results are displayed) Labs Reviewed  CBC WITH DIFFERENTIAL/PLATELET - Abnormal; Notable for the following components:      Result Value   WBC 12.2 (*)    Neutro Abs 9.3 (*)     All other components within normal limits  BASIC METABOLIC PANEL - Abnormal; Notable for the following components:   Glucose, Bld 272 (*)    All other components within normal limits  CBG MONITORING, ED - Abnormal; Notable for the following components:   Glucose-Capillary 322 (*)    All other components within normal limits    EKG None  Radiology Dg Lumbar Spine 2-3 Views  Result Date: 12/11/2018 CLINICAL DATA:  Lumbar surgery. 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 The inferior-most well developed disc space is L4-5. On images 1 and 2, surgical hardware is noted posterior to the L3-4 level. The final image demonstrates surgical hardware posterior to the L4-5 level. IMPRESSION: Transitional lumbosacral anatomy, as above. Electronically Signed   By: Duanne GuessNicholas  Plundo M.D.   On: 12/11/2018 12:59    Procedures Procedures (including critical care time)  Medications Ordered in ED Medications  sodium chloride 0.9 % bolus 1,000 mL (1,000 mLs Intravenous New Bag/Given 12/12/18 2046)  ondansetron (ZOFRAN) injection 4 mg (4 mg Intravenous Given 12/12/18 2046)  HYDROmorphone (DILAUDID) injection 1 mg (1 mg Intravenous Given 12/12/18 2047)  HYDROmorphone (DILAUDID) injection 1 mg (1 mg Intravenous Given 12/12/18 2227)     Initial Impression / Assessment and Plan / ED Course  I have reviewed the triage vital signs and the nursing notes.  Pertinent labs & imaging results that were available during my care of the patient were reviewed by me and considered in my medical decision making (see chart for details).       Suspect postoperative pain.  IV Dilaudid.  Will consult neurosurgery.  Patient responded well to Dilaudid 1 mg IV x2 aliquots.  Discussed clinical scenario with person on-call for the  practice.  He agreed with treatment plan.  No bowel or bladder incontinence at discharge.   Final Clinical Impressions(s) / ED Diagnoses   Final diagnoses:  Acute low back pain with left-sided sciatica, unspecified back pain laterality  Hyperglycemia    ED Discharge Orders    None       Donnetta Hutchingook, Saafir Abdullah, MD 12/12/18 2154    Donnetta Hutchingook, Elky Funches, MD 12/12/18 2245

## 2018-12-12 NOTE — Discharge Summary (Addendum)
Physician Discharge Summary  Patient ID: Jesse Lee MRN: [id number] DOB/AGE: 39-Mar-1982 39 y.o.  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 The patient tolerated the procedure well and was taken to the recovery room and then to the floor in stable condition. The hospital course was routine. There were no complications. The wound remained clean dry and intact. Pt had appropriate back soreness. No complaints of leg pain or new N/T/W. The patient remained afebrile with stable vital signs, and tolerated a regular diet. The patient continued to increase activities, and pain was well controlled with oral pain medications.   Consults: None  Significant Diagnostic Studies:  Results for orders placed or performed during the hospital encounter of 12/11/18  Glucose, capillary  Result Value Ref Range   Glucose-Capillary 123 (H) 70 - 99 mg/dL   Comment 1 Notify RN   Glucose, capillary  Result Value Ref Range   Glucose-Capillary 123 (H) 70 - 99 mg/dL   Comment 1 Notify RN    Comment 2 Document in Chart   Glucose, capillary  Result Value Ref Range   Glucose-Capillary 199 (H) 70 - 99 mg/dL  Glucose, capillary  Result Value Ref Range   Glucose-Capillary 261 (H) 70 - 99 mg/dL   Comment 1 Notify RN    Comment 2 Document in Chart   Glucose, capillary  Result Value Ref Range   Glucose-Capillary 412 (H) 70 - 99 mg/dL   Comment 1 Notify RN    Comment 2 Document in Chart   Glucose, capillary  Result Value Ref Range   Glucose-Capillary 265 (H) 70 - 99 mg/dL   Comment 1 Notify RN    Comment 2 Document in Chart     Chest 2 View  Result Date: 12/05/2018 CLINICAL DATA:  Preoperative exam prior to micro discectomy.  EXAM: CHEST - 2 VIEW COMPARISON:  November 30, 2016 FINDINGS: The heart size and mediastinal contours are within normal limits. Both lungs are clear. The visualized skeletal structures are unremarkable. IMPRESSION: No active cardiopulmonary disease. Electronically Signed   By: Dorise Bullion III M.D   On: 12/05/2018 09:25   Dg Lumbar Spine 2-3 Views  Result Date: 12/11/2018 CLINICAL DATA:  Lumbar surgery. 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 The inferior-most well developed disc space is L4-5. On images 1 and 2, surgical hardware is noted posterior to the L3-4 level. The final image demonstrates surgical hardware posterior to the L4-5 level. IMPRESSION: Transitional lumbosacral anatomy, as above. Electronically Signed   By: Davina Poke M.D.   On: 12/11/2018 12:59    Antibiotics:  Anti-infectives (From admission, onward)   Start     Dose/Rate Route Frequency Ordered Stop   12/11/18 1900  ceFAZolin (ANCEF) IVPB 2g/100 mL premix     2 g 200 mL/hr over 30 Minutes Intravenous Every 8 hours 12/11/18 1434 12/12/18 0321   12/11/18 1201  bacitracin 50,000 Units in sodium chloride 0.9 % 500 mL irrigation  Status:  Discontinued       As needed 12/11/18 1201 12/11/18 1315   12/11/18 0845  ceFAZolin (ANCEF) IVPB 2g/100 mL premix  2 g 200 mL/hr over 30 Minutes Intravenous On call to O.R. 12/11/18 0835 12/11/18 1122      Discharge Exam: Blood pressure (!) 146/90, pulse 71, temperature 97.8 F (36.6 C), temperature source Oral, resp. rate 16, height 6\' 3"  (1.905 m), weight 108.6 kg, SpO2 97 %. Neurologic: Grossly normal dressing dry  Discharge Medications:   hydrocodone Robaxin insulin  Disposition: home   Final Dx: L4-5 extraforaminal microdick  Discharge Instructions     Remove dressing in 72  hours   Complete by: As directed    Call MD for:  difficulty breathing, headache or visual disturbances   Complete by: As directed    Call MD for:  hives   Complete by: As directed    Call MD for:  persistant dizziness or light-headedness   Complete by: As directed    Call MD for:  persistant nausea and vomiting   Complete by: As directed    Call MD for:  redness, tenderness, or signs of infection (pain, swelling, redness, odor or green/yellow discharge around incision site)   Complete by: As directed    Call MD for:  severe uncontrolled pain   Complete by: As directed    Call MD for:  temperature >100.4   Complete by: As directed    Diet - low sodium heart healthy   Complete by: As directed    Driving Restrictions   Complete by: As directed    No driving for 2 weeks, no riding in the car for 1 week   Increase activity slowly   Complete by: As directed    Lifting restrictions   Complete by: As directed    No lifting more than 8 lbs      Follow-up Information    Tia AlertJones, David S, MD. Schedule an appointment as soon as possible for a visit in 2 week(s).   Specialty: Neurosurgery Contact information: [address] [place] [address] [phone]        Tia AlertJones, David S, MD .   Specialty: Neurosurgery Contact information: [phone] [place] [address] [phone]            Signed: Tiana LoftKimberly Hannah [facility] Center[facility]ran 12/12/2018, 8:06 AM

## 2018-12-12 NOTE — ED Notes (Signed)
Pt last took a vicodin and robaxin at 1730 with no relief

## 2018-12-12 NOTE — Discharge Instructions (Addendum)
I spoke with the doctor on call for Dr. Sherley Bounds.  Follow-up with your office tomorrow if you not feeling well.  Adjust your pain medicine as we discussed.

## 2018-12-12 NOTE — Plan of Care (Signed)
Pt doing well. Pt given D/C instructions with verbal understanding. Rx's were sent to Pt's pharmacy by MD. Pt's incision is clean and dry with no sign of infection. Pt's IV was removed prior to D/C. Pt D/C'd home via walking @ 0900 per MD order. Pt is stable @ D/C and has no other needs at this time. Holli Humbles, RN

## 2018-12-12 NOTE — ED Triage Notes (Signed)
Pt had back surgery yesterday and was discharged this am with minimal pain; pt states a couple of hours ago he started having severe pain to bilateral calfs, thighs and buttocks

## 2020-03-22 ENCOUNTER — Other Ambulatory Visit: Payer: Self-pay

## 2020-03-22 ENCOUNTER — Emergency Department (HOSPITAL_COMMUNITY)
Admission: EM | Admit: 2020-03-22 | Discharge: 2020-03-22 | Disposition: A | Discharge status: Home / Self Care | Payer: Self-pay | Attending: Emergency Medicine | Admitting: Emergency Medicine

## 2020-03-22 ENCOUNTER — Emergency Department (HOSPITAL_COMMUNITY): Payer: Self-pay

## 2020-03-22 ENCOUNTER — Encounter (HOSPITAL_COMMUNITY): Payer: Self-pay | Admitting: Emergency Medicine

## 2020-03-22 DIAGNOSIS — R112 Nausea with vomiting, unspecified: Secondary | ICD-10-CM | POA: Insufficient documentation

## 2020-03-22 DIAGNOSIS — F17210 Nicotine dependence, cigarettes, uncomplicated: Secondary | ICD-10-CM | POA: Insufficient documentation

## 2020-03-22 DIAGNOSIS — E119 Type 2 diabetes mellitus without complications: Secondary | ICD-10-CM | POA: Insufficient documentation

## 2020-03-22 DIAGNOSIS — R319 Hematuria, unspecified: Secondary | ICD-10-CM | POA: Insufficient documentation

## 2020-03-22 DIAGNOSIS — R1031 Right lower quadrant pain: Secondary | ICD-10-CM | POA: Insufficient documentation

## 2020-03-22 DIAGNOSIS — R350 Frequency of micturition: Secondary | ICD-10-CM | POA: Insufficient documentation

## 2020-03-22 DIAGNOSIS — Z794 Long term (current) use of insulin: Secondary | ICD-10-CM | POA: Insufficient documentation

## 2020-03-22 DIAGNOSIS — I10 Essential (primary) hypertension: Secondary | ICD-10-CM | POA: Insufficient documentation

## 2020-03-22 DIAGNOSIS — Z9089 Acquired absence of other organs: Secondary | ICD-10-CM | POA: Insufficient documentation

## 2020-03-22 DIAGNOSIS — R109 Unspecified abdominal pain: Secondary | ICD-10-CM

## 2020-03-22 DIAGNOSIS — K219 Gastro-esophageal reflux disease without esophagitis: Secondary | ICD-10-CM | POA: Insufficient documentation

## 2020-03-22 DIAGNOSIS — J45909 Unspecified asthma, uncomplicated: Secondary | ICD-10-CM | POA: Insufficient documentation

## 2020-03-22 LAB — COMPREHENSIVE METABOLIC PANEL
ALT: 26 U/L (ref 0–44)
AST: 27 U/L (ref 15–41)
Albumin: 4.1 g/dL (ref 3.5–5.0)
Alkaline Phosphatase: 54 U/L (ref 38–126)
Anion gap: 8 (ref 5–15)
BUN: 11 mg/dL (ref 6–20)
CO2: 27 mmol/L (ref 22–32)
Calcium: 9 mg/dL (ref 8.9–10.3)
Chloride: 103 mmol/L (ref 98–111)
Creatinine, Ser: 1.15 mg/dL (ref 0.61–1.24)
GFR, Estimated: >60 mL/min (ref >60)
Glucose, Bld: 178 mg/dL — ABNORMAL HIGH (ref 70–99)
Potassium: 4.2 mmol/L (ref 3.5–5.1)
Sodium: 138 mmol/L (ref 135–145)
Total Bilirubin: 0.7 mg/dL (ref 0.3–1.2)
Total Protein: 7.1 g/dL (ref 6.5–8.1)

## 2020-03-22 LAB — CBC
HCT: 50.3 % (ref 39.0–52.0)
Hemoglobin: 16.9 g/dL (ref 13.0–17.0)
MCH: 30.6 pg (ref 26.0–34.0)
MCHC: 33.6 g/dL (ref 30.0–36.0)
MCV: 91.1 fL (ref 80.0–100.0)
Platelets: 222 10*3/uL (ref 150–400)
RBC: 5.52 MIL/uL (ref 4.22–5.81)
RDW: 13.7 % (ref 11.5–15.5)
WBC: 6.7 10*3/uL (ref 4.0–10.5)
nRBC: 0 % (ref 0.0–0.2)

## 2020-03-22 LAB — URINALYSIS, ROUTINE W REFLEX MICROSCOPIC
Bacteria, UA: NONE SEEN
Bilirubin Urine: NEGATIVE
Glucose, UA: 150 mg/dL — AB
Ketones, ur: NEGATIVE mg/dL
Leukocytes,Ua: NEGATIVE
Nitrite: NEGATIVE
Protein, ur: NEGATIVE mg/dL
Specific Gravity, Urine: 1.018 (ref 1.005–1.030)
pH: 5 (ref 5.0–8.0)

## 2020-03-22 LAB — LIPASE, BLOOD: Lipase: 49 U/L (ref 11–51)

## 2020-03-22 MED ORDER — METHOCARBAMOL 500 MG PO TABS: 500.0000 mg | ORAL_TABLET | Freq: Two times a day (BID) | ORAL | 0 refills | Status: DC | PRN

## 2020-03-22 MED ORDER — KETOROLAC TROMETHAMINE 60 MG/2ML IM SOLN
60.0000 mg | Freq: Once | INTRAMUSCULAR | Status: AC
  Administered 2020-03-22: 60 mg via INTRAMUSCULAR
  Filled 2020-03-22: qty 2

## 2020-03-22 MED ORDER — IBUPROFEN 600 MG PO TABS: 600.0000 mg | ORAL_TABLET | Freq: Four times a day (QID) | ORAL | 0 refills | Status: AC | PRN

## 2020-03-22 NOTE — ED Triage Notes (Signed)
C/o R sided abd pain with nausea x 1 week and hematuria that started today.

## 2020-03-22 NOTE — Discharge Instructions (Signed)
Please follow-up with your primary care provider regarding today's encounter and for ongoing evaluation management.  Your work-up today was reassuring. No evidence of stone. I suspect that your right-sided flank discomfort is related to muscular strain.  Please take the Robaxin and ibuprofen, as directed. Do not combine the ibuprofen with other NSAIDs.  You were given a prescription for Robaxin which is a muscle relaxer.  You should not drive, work, consume alcohol, or operate machinery while taking this medication as it can make you very drowsy.    Please return to the ED or seek immediate medical attention should you experience any new or worsening symptoms.

## 2020-03-22 NOTE — ED Provider Notes (Signed)
[facility] EMERGENCY DEPARTMENT Provider Note   CSN: [id number] Arrival date & time: 03/22/20  1010     History Chief Complaint  Patient presents with  . Abdominal Pain    Jesse Lee is a 39 y.o. male with PMH of insulin-dependent diabetes who presents the ED with 1 week history of right-sided abdominal pain.  Patient states that over the course the past week he has been experiencing progressively worsening yet still variable right-sided flank and right sided lower abdominal pain.  This morning he noticed blood in his urine, but did not come to the ER for evaluation.  He states that his pain is currently "8 and change" and has been higher at times.  Patient also states that he has had intermittent episodes of nausea and one episode of nonbloody emesis.  He is not currently nauseated on exam.  In addition to the hematuria this morning, he has been noticing increased urinary difficulty and frequency.  No significant dysuria.  He denies any fevers or chills.  He wanted to be seen by his primary care provider, Dr. Penelope Coop with [facility], however she was unavailable this past week.  He tried to abstain from coming to the ED for evaluation, but the increasing pain and today's hematuria prompted him to come to the ER for evaluation.  HPI     Past Medical History:  Diagnosis Date  . Asthma   . Diabetes mellitus without complication (HCC)    type 2  . GERD (gastroesophageal reflux disease)   . Hypertension   . Pericarditis   . Pneumonia     Patient Active Problem List   Diagnosis Date Noted  . S/P lumbar laminectomy 12/11/2018    Past Surgical History:  Procedure Laterality Date  . APPENDECTOMY    . LUMBAR LAMINECTOMY/DECOMPRESSION MICRODISCECTOMY Left 12/11/2018   Procedure: Left Lumbar Four-Five Extraforaminal Microdiscectomy;  Surgeon: Tia Alert, MD;  Location: [facility] OR;  Service: Neurosurgery;  Laterality: Left;  . TONSILLECTOMY         No family  history on file.  Social History   Tobacco Use  . Smoking status: Current Every Day Smoker    Years: 1.00    Types: Cigarettes  . Smokeless tobacco: Never Used  Substance Use Topics  . Alcohol use: Yes    Alcohol/week: 5.0 standard drinks    Types: 5 Standard drinks or equivalent per week  . Drug use: Yes    Types: Marijuana    Home Medications Prior to Admission medications   Medication Sig Start Date End Date Taking? Authorizing Provider  HYDROcodone-acetaminophen (NORCO/VICODIN) 5-325 MG tablet Take 1-2 tablets by mouth every 6 (six) hours as needed for moderate pain or severe pain. 12/12/18   Meyran, Tiana Loft, NP  ibuprofen (ADVIL) 600 MG tablet Take 1 tablet (600 mg total) by mouth every 6 (six) hours as needed. 03/22/20   Lorelee New, PA-C  insulin NPH-regular Human (NOVOLIN 70/30) (70-30) 100 UNIT/ML injection Inject 3 Units into the skin 2 (two) times daily with a meal. Patient taking differently: Inject 20 Units into the skin 3 (three) times daily.  11/30/17   Devoria Albe, MD  methocarbamol (ROBAXIN) 500 MG tablet Take 1 tablet (500 mg total) by mouth 2 (two) times daily as needed for muscle spasms. 03/22/20   Lorelee New, PA-C    Allergies    Patient has no known allergies.  Review of Systems   Review of Systems  All other systems reviewed  and are negative.   Physical Exam Updated Vital Signs BP (!) 131/98 (BP Location: Right Arm)   Pulse 65   Temp 98.2 F (36.8 C) (Oral)   Resp 16   SpO2 99%   Physical Exam Vitals and nursing note reviewed. Exam conducted with a chaperone present.  Constitutional:      Appearance: Normal appearance.  HENT:     Head: Normocephalic and atraumatic.  Eyes:     General: No scleral icterus.    Conjunctiva/sclera: Conjunctivae normal.  Cardiovascular:     Rate and Rhythm: Normal rate and regular rhythm.     Pulses: Normal pulses.     Heart sounds: Normal heart sounds.  Pulmonary:     Effort: Pulmonary effort  is normal. No respiratory distress.     Breath sounds: Normal breath sounds.  Abdominal:     Comments: Soft, nondistended.  Mild TTP in RLQ.  Positive right-sided CVAT.  No left-sided CVAT.  No significant abdominal TTP elsewhere.  No overlying skin changes.  Skin:    General: Skin is dry.     Capillary Refill: Capillary refill takes less than 2 seconds.  Neurological:     Mental Status: He is alert.     GCS: GCS eye subscore is 4. GCS verbal subscore is 5. GCS motor subscore is 6.  Psychiatric:        Mood and Affect: Mood normal.        Behavior: Behavior normal.        Thought Content: Thought content normal.     ED Results / Procedures / Treatments   Labs (all labs ordered are listed, but only abnormal results are displayed) Labs Reviewed  COMPREHENSIVE METABOLIC PANEL - Abnormal; Notable for the following components:      Result Value   Glucose, Bld 178 (*)    All other components within normal limits  URINALYSIS, ROUTINE W REFLEX MICROSCOPIC - Abnormal; Notable for the following components:   Glucose, UA 150 (*)    Hgb urine dipstick SMALL (*)    All other components within normal limits  LIPASE, BLOOD  CBC    EKG None  Radiology CT Renal Stone Study  Result Date: 03/22/2020 CLINICAL DATA:  Bilateral flank pain for 1 week. Hematuria, nausea, and vomiting. EXAM: CT ABDOMEN AND PELVIS WITHOUT CONTRAST TECHNIQUE: Multidetector CT imaging of the abdomen and pelvis was performed following the standard protocol without IV contrast. COMPARISON:  CT abdomen pelvis dated 09/26/2018. FINDINGS: Lower chest: No acute abnormality. Hepatobiliary: A 9 mm hypoattenuating lesion near the dome of the liver posteriorly appears not significantly changed since 09/26/2018 and is presumed benign given its small size. No gallstones, gallbladder wall thickening, or biliary dilatation. Pancreas: Unremarkable. No pancreatic ductal dilatation or surrounding inflammatory changes. Spleen: Normal in  size without focal abnormality. Adrenals/Urinary Tract: Adrenal glands are unremarkable. Kidneys are normal, without renal calculi, focal lesion, or hydronephrosis. Bladder is unremarkable. Stomach/Bowel: Stomach is within normal limits. No pericecal inflammatory changes are noted to suggest acute appendicitis. No evidence of bowel wall thickening, distention, or inflammatory changes. Vascular/Lymphatic: No significant vascular findings are present. No enlarged abdominal or pelvic lymph nodes. Reproductive: Prostate is unremarkable. Other: No abdominal wall hernia or abnormality. No abdominopelvic ascites. Musculoskeletal: No acute or significant osseous findings. IMPRESSION: No findings to explain the patient's symptoms. Electronically Signed   By: Romona Curls M.D.   On: 03/22/2020 12:47    Procedures Procedures (including critical care time)  Medications Ordered in ED Medications  ketorolac (TORADOL) injection 60 mg (60 mg Intramuscular Given 03/22/20 1315)    ED Course  I have reviewed the triage vital signs and the nursing notes.  Pertinent labs & imaging results that were available during my care of the patient were reviewed by me and considered in my medical decision making (see chart for details).    MDM Rules/Calculators/A&P                          Labs CBC: No anemia or leukocytosis concerning for infection. CMP: Mild hyperglycemia to 178, otherwise unremarkable. Lipase: Within normal limits. UA: Small hemoglobin noted.  Given patient's history, physical exam, and hematuria noted on UA, will proceed with CT renal stone study.  I personally reviewed CT renal stone study which demonstrates no findings or explanation for patient's right-sided abdominal discomfort.  No acute or emergent pathology noted.  No stones.  On subsequent evaluation, patient tells me that he is actually experienced these symptoms before and he suspects that it is a muscular strain.  He is frustrated that  they continue to persist without any obvious aggravating factor. He does admit that he has been couch surfing the past couple of weeks and suspects that it is due to uncomfortable sleep position. He will follow up with his primary care provider early next week. We will prescribe him Robaxin and ibuprofen 600 mg every 6 hours as needed for pain symptoms.  ED return precautions discussed. Patient voices understanding and is agreeable to the plan.    Final Clinical Impression(s) / ED Diagnoses Final diagnoses:  Right flank pain    Rx / DC Orders ED Discharge Orders         Ordered    ibuprofen (ADVIL) 600 MG tablet  Every 6 hours PRN        03/22/20 1330    methocarbamol (ROBAXIN) 500 MG tablet  2 times daily PRN        03/22/20 1330           Lorelee New, PA-C 03/22/20 1330    Margarita Grizzle, MD 03/22/20 2022

## 2020-04-09 IMAGING — CT CT RENAL STONE PROTOCOL
2 of 4 series · 16 of 46 positions shown, 18 images · non-contrast
Comparison: None.

CLINICAL DATA: Right upper quadrant, right flank tenderness

EXAM:
CT ABDOMEN AND PELVIS WITHOUT CONTRAST
TECHNIQUE: Multidetector CT imaging of the abdomen and pelvis was performed
following the standard protocol without IV contrast.

[Series 3: ap without · axial · non-contrast · 0.72mm/px · z∈[+946,+1346]mm · 13 of 92 slices shown, 15 images]
[im 6/92  soft-tissue]
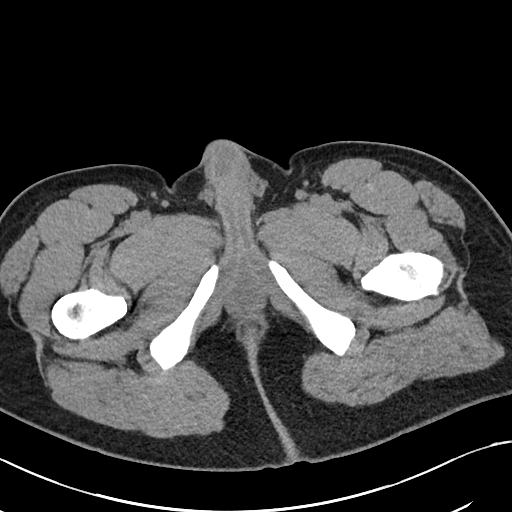
[im 6/92  bone]
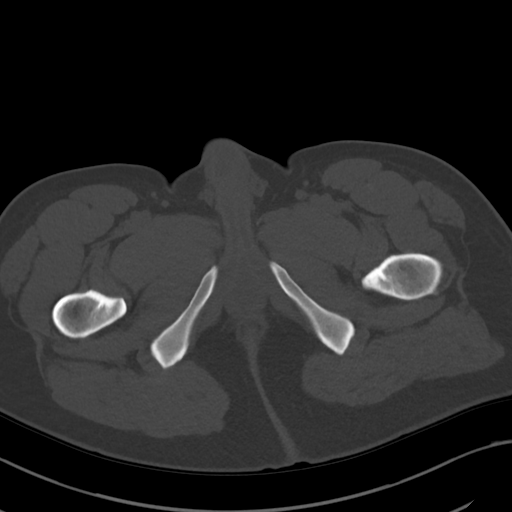
[im 11/92  soft-tissue]
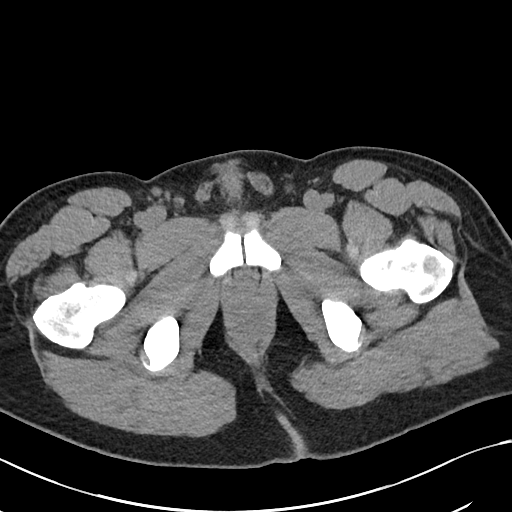
[im 21/92  soft-tissue]
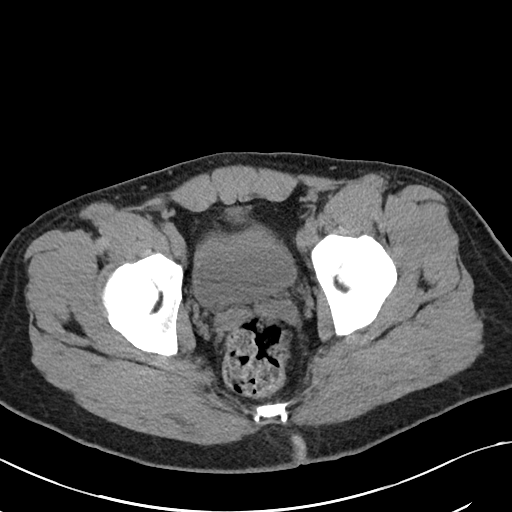
[im 26/92  soft-tissue]
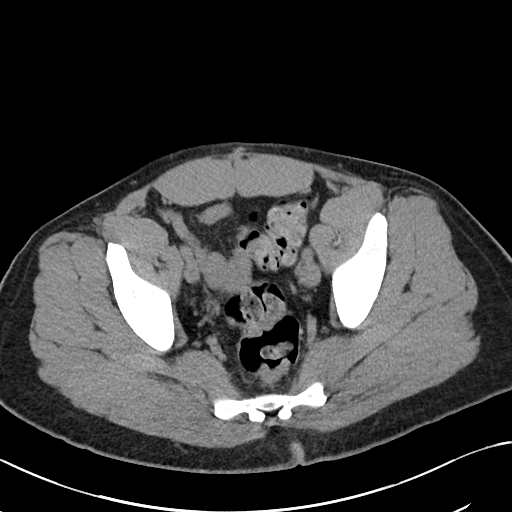
[im 31/92  soft-tissue]
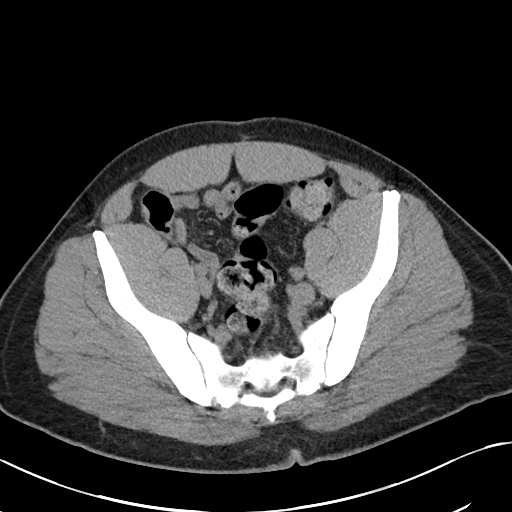
[im 41/92  soft-tissue]
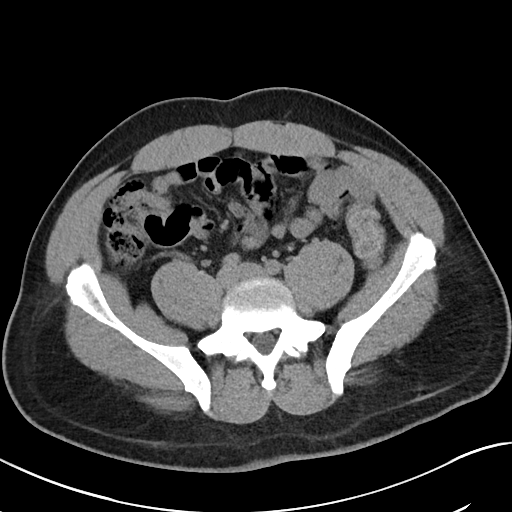
[im 46/92  soft-tissue]
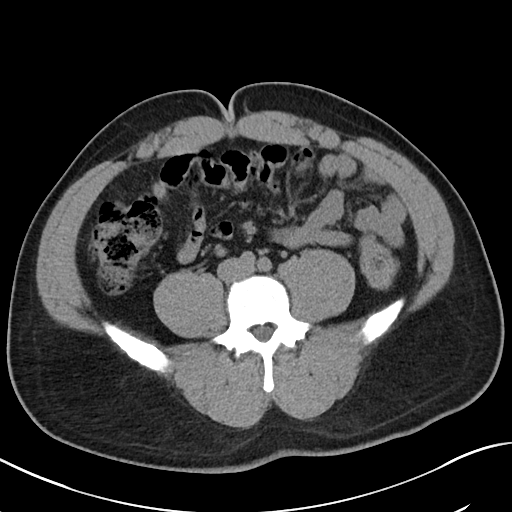
[im 51/92  soft-tissue]
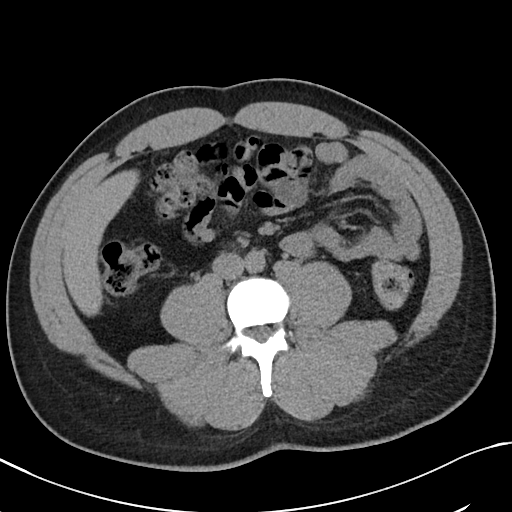
[im 61/92  soft-tissue]
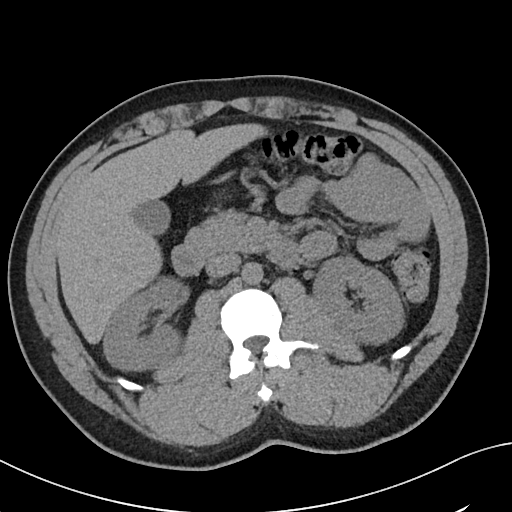
[im 61/92  bone]
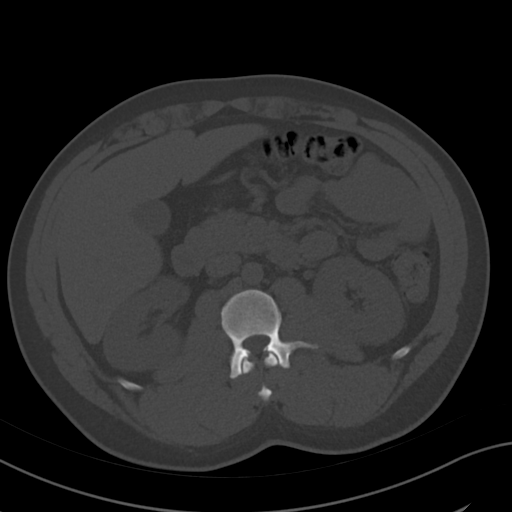
[im 66/92  soft-tissue]
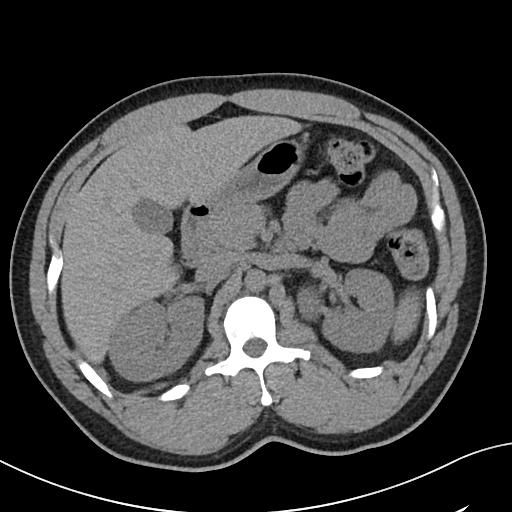
[im 71/92  soft-tissue]
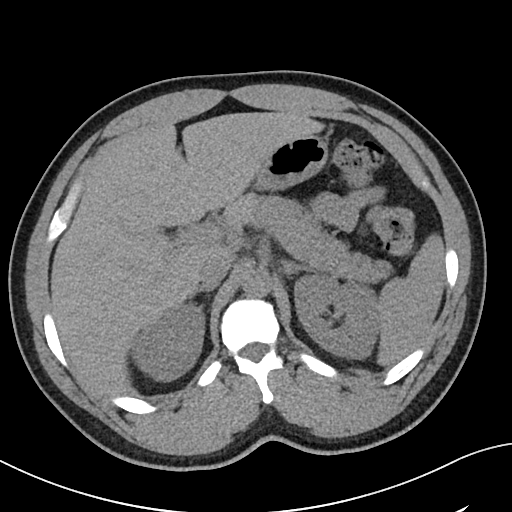
[im 81/92  soft-tissue]
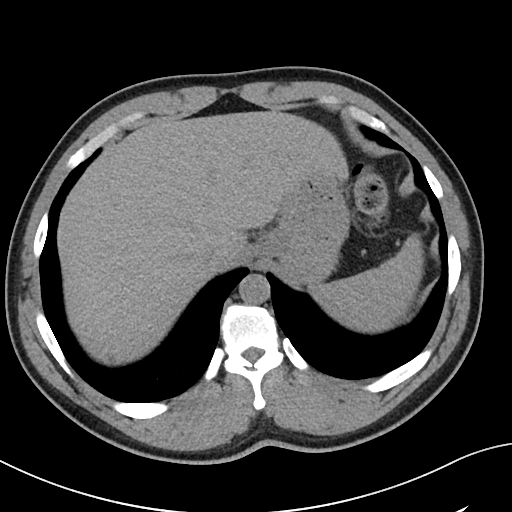
[im 86/92  soft-tissue]
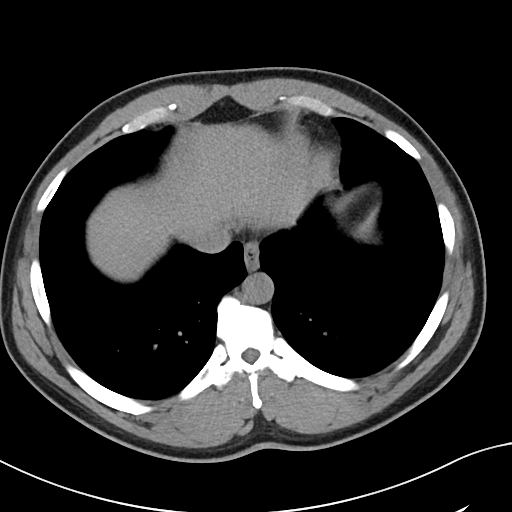

[Series 6: cor · coronal · 0.78mm/px · 3 of 100 slices shown]
[im 34/100  soft-tissue]
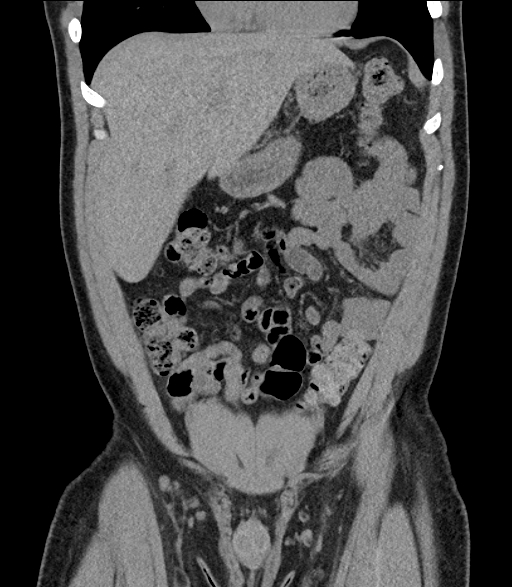
[im 45/100  soft-tissue]
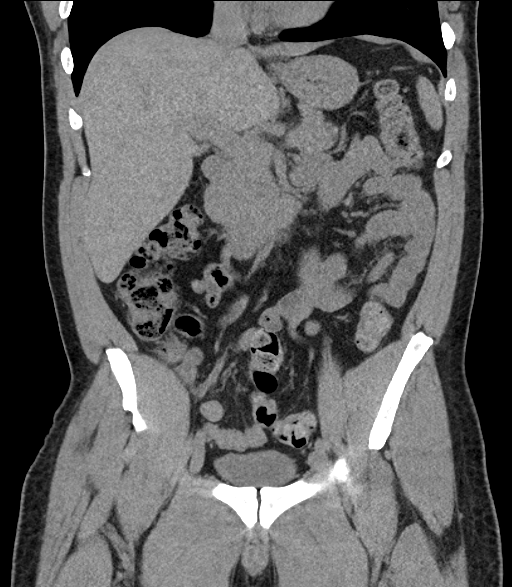
[im 56/100  soft-tissue]
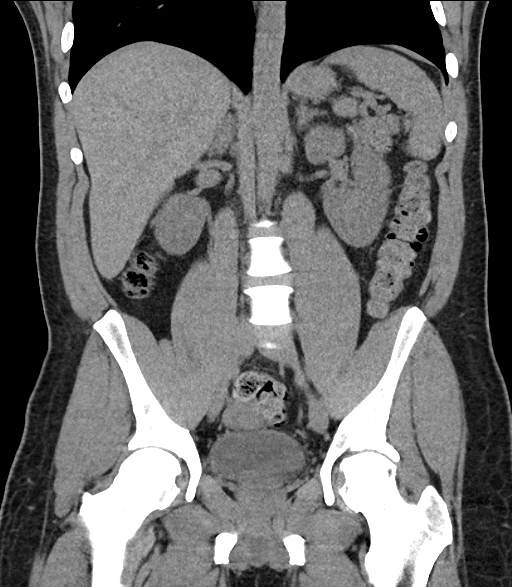

[16 of 46 positions shown; findings below may reference images not displayed]

FINDINGS: Lower chest: No acute abnormality.

Hepatobiliary: No focal liver abnormality is seen. No gallstones,
gallbladder wall thickening, or biliary dilatation.

Pancreas: Unremarkable. No pancreatic ductal dilatation or
surrounding inflammatory changes.

Spleen: Normal in size without focal abnormality.

Adrenals/Urinary Tract: Adrenal glands are unremarkable. Kidneys are
normal, without renal calculi, focal lesion, or hydronephrosis.
Bladder is unremarkable.

Stomach/Bowel: Stomach is within normal limits. Appendix is not
clearly visualized and may be surgically absent. No evidence of
bowel wall thickening, distention, or inflammatory changes. Moderate
burden of stool and stool balls in the colon.

Vascular/Lymphatic: No significant vascular findings are present. No
enlarged abdominal or pelvic lymph nodes.

Reproductive: No mass or other abnormality.

Other: No abdominal wall hernia or abnormality. No abdominopelvic
ascites.

Musculoskeletal: No acute or significant osseous findings.
IMPRESSION: 1. No non-contrast CT findings of the abdomen or pelvis to explain
pain. No evidence of urinary tract calculus or hydronephrosis.

2. The appendix is not clearly visualized and may be surgically
absent. No secondary evidence of appendicitis in the right lower
quadrant.

3.  Moderate burden of stool and stool balls in the colon.

## 2020-04-09 IMAGING — US ULTRASOUND ABDOMEN LIMITED
1 series · 14 of 25 positions shown · non-contrast
Comparison: None.

CLINICAL DATA: 37-year-old male with right upper quadrant pain

EXAM:
ULTRASOUND ABDOMEN LIMITED RIGHT UPPER QUADRANT

[Series 1: ultrasound abdomen limited · 14 of 36 slices shown]
[im 1/36]
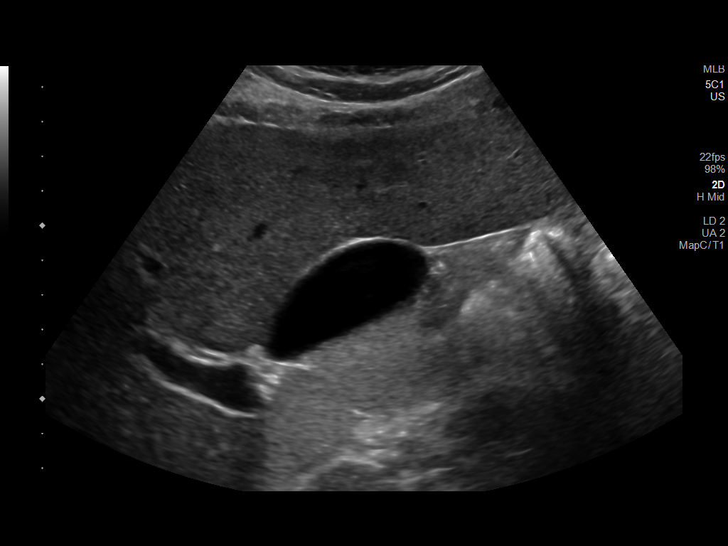
[im 3/36]
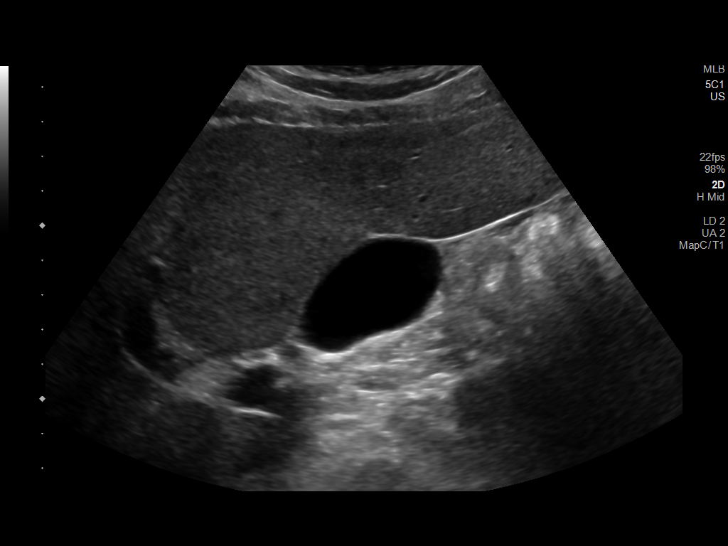
[im 6/36]
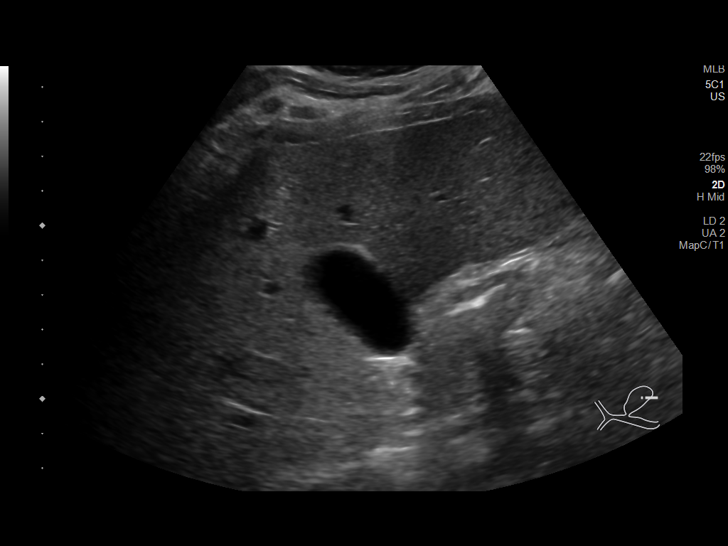
[im 9/36]
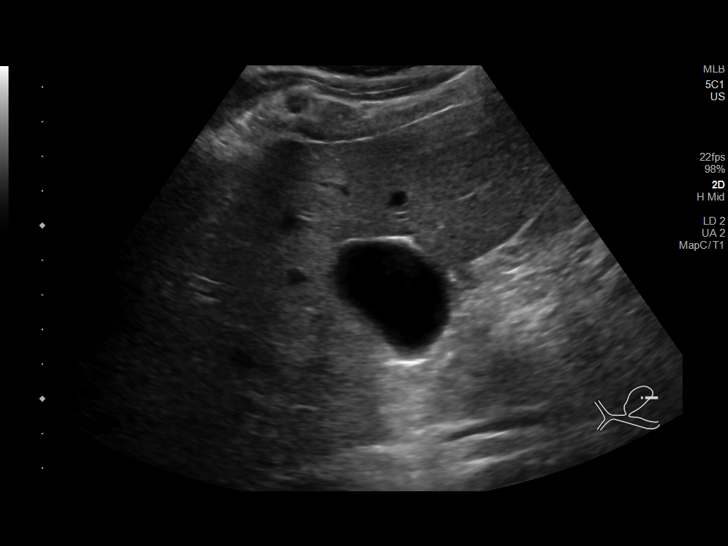
[im 12/36]
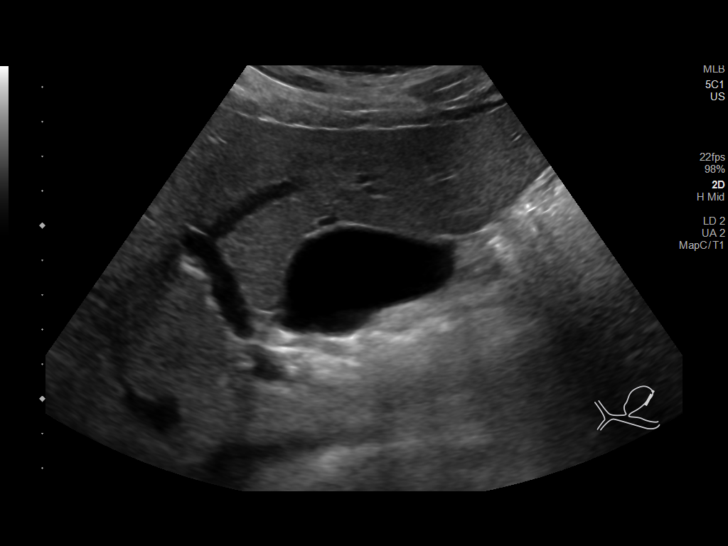
[im 14/36]
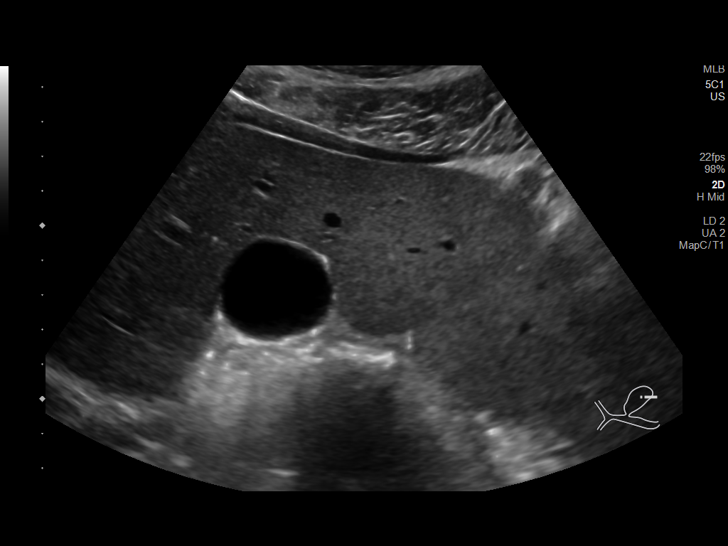
[im 17/36]
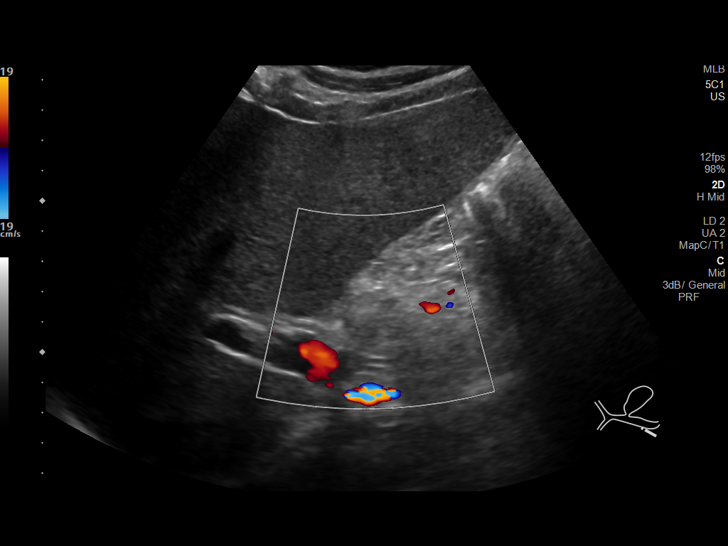
[im 19/36]
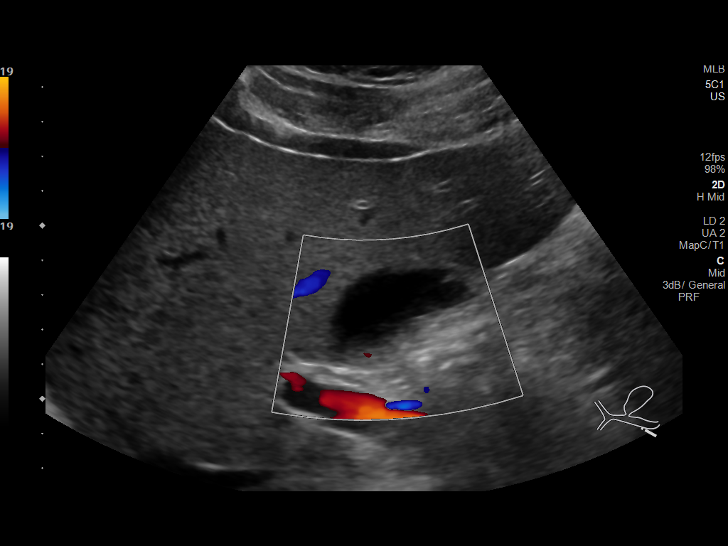
[im 22/36]
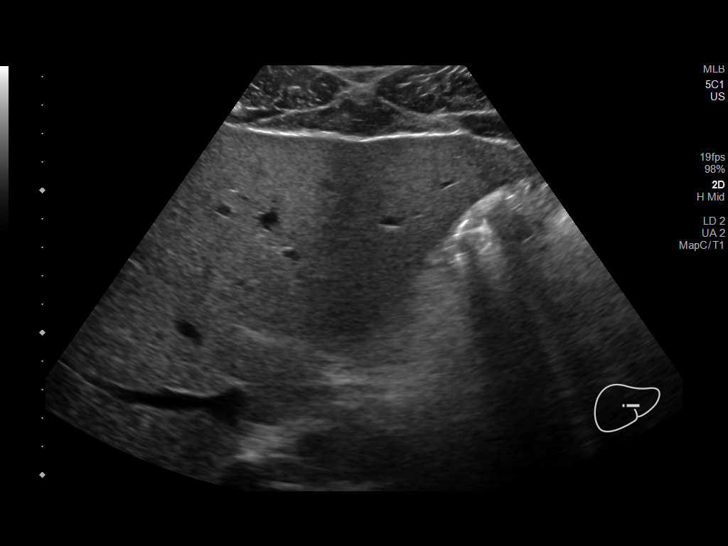
[im 24/36]
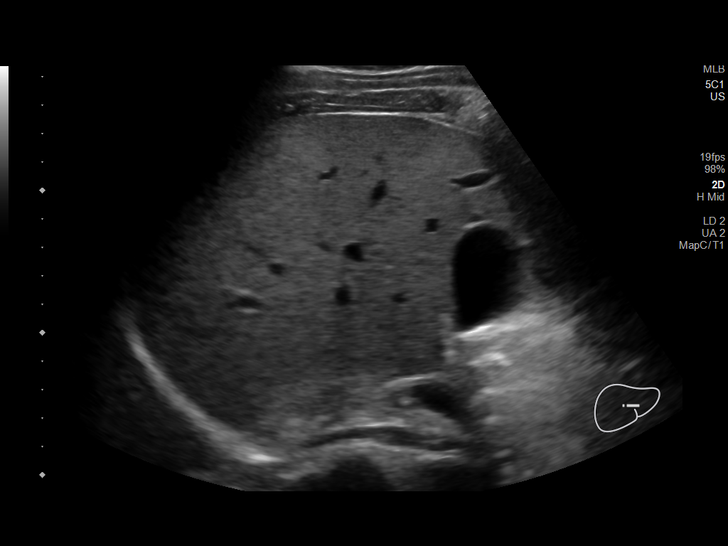
[im 27/36]
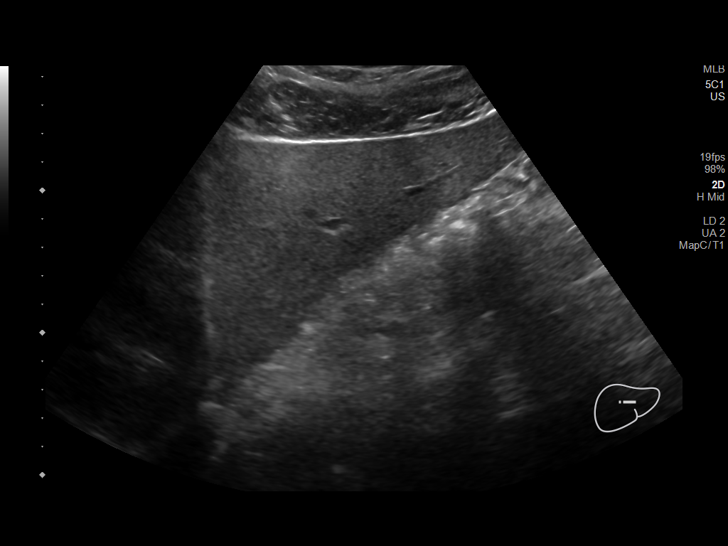
[im 30/36]
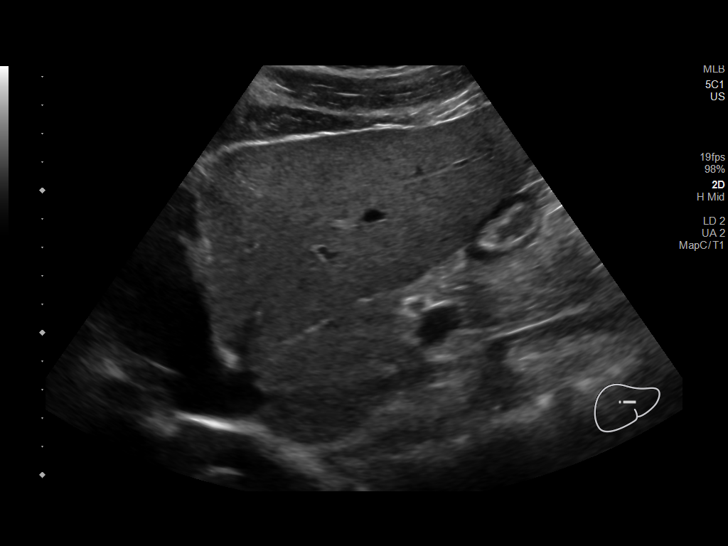
[im 33/36]
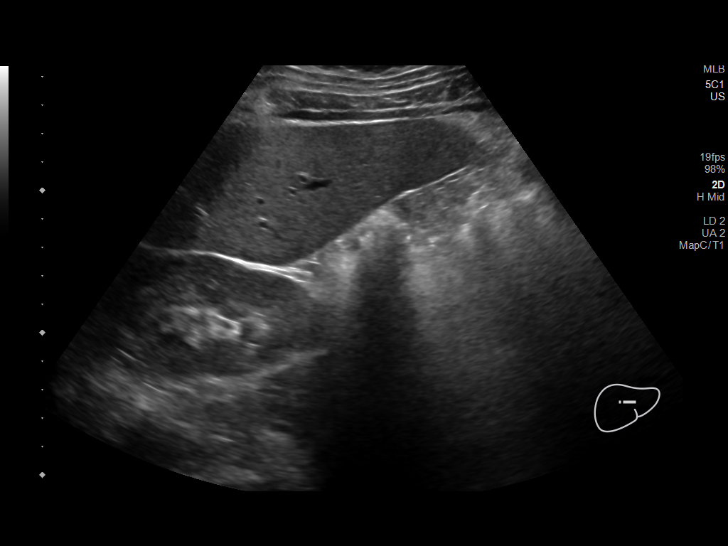
[im 36/36]
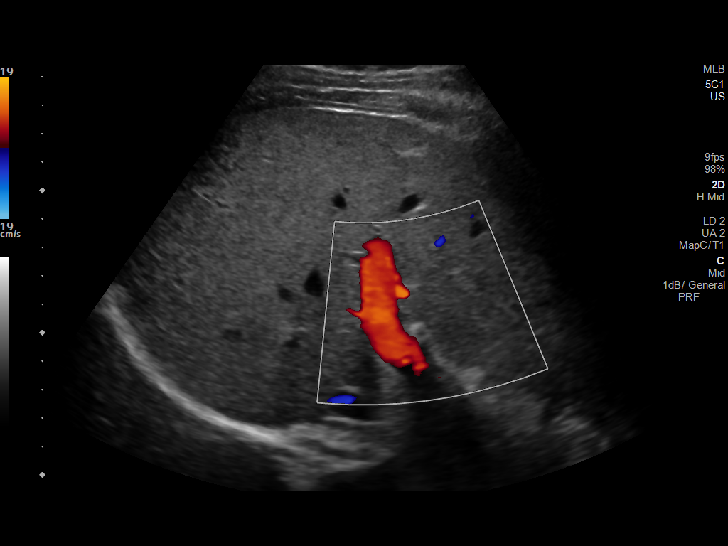

[14 of 25 positions shown; findings below may reference images not displayed]

FINDINGS: Gallbladder:

No gallstones or wall thickening visualized. No sonographic Murphy
sign noted by sonographer.

Common bile duct:

Diameter: 3 mm

Liver:

No focal lesion identified. Within normal limits in parenchymal
echogenicity. Portal vein is patent on color Doppler imaging with
normal direction of blood flow towards the liver.
IMPRESSION: Unremarkable sonographic survey of the right upper quadrant

## 2021-09-27 ENCOUNTER — Other Ambulatory Visit: Payer: Self-pay

## 2021-09-27 ENCOUNTER — Encounter (HOSPITAL_COMMUNITY): Payer: Self-pay | Admitting: Emergency Medicine

## 2021-09-27 ENCOUNTER — Emergency Department (HOSPITAL_COMMUNITY): Payer: Self-pay

## 2021-09-27 ENCOUNTER — Emergency Department (HOSPITAL_COMMUNITY)
Admission: EM | Admit: 2021-09-27 | Discharge: 2021-09-27 | Disposition: A | Discharge status: Home / Self Care | Payer: Self-pay | Attending: Emergency Medicine | Admitting: Emergency Medicine

## 2021-09-27 DIAGNOSIS — Z760 Encounter for issue of repeat prescription: Secondary | ICD-10-CM | POA: Insufficient documentation

## 2021-09-27 DIAGNOSIS — R109 Unspecified abdominal pain: Secondary | ICD-10-CM | POA: Insufficient documentation

## 2021-09-27 DIAGNOSIS — M5442 Lumbago with sciatica, left side: Secondary | ICD-10-CM | POA: Insufficient documentation

## 2021-09-27 DIAGNOSIS — E1165 Type 2 diabetes mellitus with hyperglycemia: Secondary | ICD-10-CM | POA: Insufficient documentation

## 2021-09-27 DIAGNOSIS — Z794 Long term (current) use of insulin: Secondary | ICD-10-CM | POA: Insufficient documentation

## 2021-09-27 DIAGNOSIS — J45909 Unspecified asthma, uncomplicated: Secondary | ICD-10-CM | POA: Insufficient documentation

## 2021-09-27 DIAGNOSIS — I10 Essential (primary) hypertension: Secondary | ICD-10-CM | POA: Insufficient documentation

## 2021-09-27 LAB — CBC WITH DIFFERENTIAL/PLATELET
Abs Immature Granulocytes: 0.02 10*3/uL (ref 0.00–0.07)
Basophils Absolute: 0 10*3/uL (ref 0.0–0.1)
Basophils Relative: 1 %
Eosinophils Absolute: 0.1 10*3/uL (ref 0.0–0.5)
Eosinophils Relative: 2 %
HCT: 45.3 % (ref 39.0–52.0)
Hemoglobin: 15.8 g/dL (ref 13.0–17.0)
Immature Granulocytes: 0 %
Lymphocytes Relative: 19 %
Lymphs Abs: 1.5 10*3/uL (ref 0.7–4.0)
MCH: 31.4 pg (ref 26.0–34.0)
MCHC: 34.9 g/dL (ref 30.0–36.0)
MCV: 90.1 fL (ref 80.0–100.0)
Monocytes Absolute: 0.6 10*3/uL (ref 0.1–1.0)
Monocytes Relative: 7 %
Neutro Abs: 5.7 10*3/uL (ref 1.7–7.7)
Neutrophils Relative %: 71 %
Platelets: 228 10*3/uL (ref 150–400)
RBC: 5.03 MIL/uL (ref 4.22–5.81)
RDW: 12.9 % (ref 11.5–15.5)
WBC: 7.9 10*3/uL (ref 4.0–10.5)
nRBC: 0 % (ref 0.0–0.2)

## 2021-09-27 LAB — URINALYSIS, ROUTINE W REFLEX MICROSCOPIC
Bacteria, UA: NONE SEEN
Bilirubin Urine: NEGATIVE
Glucose, UA: >=500 mg/dL — AB
Hgb urine dipstick: NEGATIVE
Ketones, ur: NEGATIVE mg/dL
Leukocytes,Ua: NEGATIVE
Nitrite: NEGATIVE
Protein, ur: NEGATIVE mg/dL
Specific Gravity, Urine: 1.014 (ref 1.005–1.030)
pH: 5 (ref 5.0–8.0)

## 2021-09-27 LAB — BASIC METABOLIC PANEL
Anion gap: 5 (ref 5–15)
BUN: 12 mg/dL (ref 6–20)
CO2: 27 mmol/L (ref 22–32)
Calcium: 9.2 mg/dL (ref 8.9–10.3)
Chloride: 104 mmol/L (ref 98–111)
Creatinine, Ser: 1.01 mg/dL (ref 0.61–1.24)
GFR, Estimated: >60 mL/min (ref >60)
Glucose, Bld: 205 mg/dL — ABNORMAL HIGH (ref 70–99)
Potassium: 4.4 mmol/L (ref 3.5–5.1)
Sodium: 136 mmol/L (ref 135–145)

## 2021-09-27 MED ORDER — LISINOPRIL 10 MG PO TABS: 10.0000 mg | ORAL_TABLET | Freq: Every day | ORAL | 0 refills | Status: AC

## 2021-09-27 MED ORDER — METHOCARBAMOL 500 MG PO TABS
500.0000 mg | ORAL_TABLET | Freq: Once | ORAL | Status: AC
  Administered 2021-09-27: 500 mg via ORAL
  Filled 2021-09-27: qty 1

## 2021-09-27 MED ORDER — HYDROMORPHONE HCL 1 MG/ML IJ SOLN
1.0000 mg | Freq: Once | INTRAMUSCULAR | Status: AC
  Administered 2021-09-27: 1 mg via INTRAVENOUS
  Filled 2021-09-27: qty 1

## 2021-09-27 MED ORDER — PREDNISONE 10 MG PO TABS: ORAL_TABLET | ORAL | 0 refills | Status: AC

## 2021-09-27 MED ORDER — KETOROLAC TROMETHAMINE 30 MG/ML IJ SOLN
30.0000 mg | Freq: Once | INTRAMUSCULAR | Status: AC
  Administered 2021-09-27: 30 mg via INTRAVENOUS
  Filled 2021-09-27: qty 1

## 2021-09-27 MED ORDER — ONDANSETRON HCL 4 MG/2ML IJ SOLN
4.0000 mg | Freq: Once | INTRAMUSCULAR | Status: AC
  Administered 2021-09-27: 4 mg via INTRAVENOUS
  Filled 2021-09-27: qty 2

## 2021-09-27 MED ORDER — METHOCARBAMOL 500 MG PO TABS: 500.0000 mg | ORAL_TABLET | Freq: Three times a day (TID) | ORAL | 0 refills | Status: AC

## 2021-09-27 MED ORDER — OXYCODONE-ACETAMINOPHEN 5-325 MG PO TABS: 1.0000 | ORAL_TABLET | ORAL | 0 refills | Status: AC | PRN

## 2021-09-27 NOTE — ED Provider Notes (Addendum)
?[facility] ?Provider Note ? ? ?CSN: [id number] ?Arrival date & time: 09/27/21  1152 ? ?  ? ?History ? ?Chief Complaint  ?Patient presents with  ? Back Pain  ? ? ?Jesse Lee is a 41 y.o. male. ? ? ?Back Pain ?Associated symptoms: numbness (Pins and needle sensation to the left foot)   ?Associated symptoms: no abdominal pain, no chest pain, no dysuria, no fever and no weakness   ? ?  ? ? ?Jesse Lee is a 41 y.o. male with past medical history of hypertension, asthma, type 2 diabetes and lumbar laminectomy in 2020 who presents to the Emergency Department complaining of left flank pain and left low back pain.  Symptoms have been gradually worsening x2 to 3 days.  He described onset of pain to his left flank that he felt was associated to a possible kidney stone.  He states the following day the pain became sharp and radiating into his buttock and down his left leg.  He states the pain shoots to the level of his lower leg.  He is having a pins-and-needles "tingling" sensation of his left foot.  Pain is worse with walking, standing and sitting.  Pain somewhat improves at rest.  He states he has had kidney infection in the past, but this pain feels more similar to his back pain prior to his laminectomy.  He denies any abdominal pain, urine or bowel changes, and weakness of his lower extremities.  No known injury.  No fever or chills.  He has been taking gabapentin and ibuprofen with only minimal relief. ? ? ?Home Medications ?Prior to Admission medications   ?Medication Sig Start Date End Date Taking? Authorizing Provider  ?HYDROcodone-acetaminophen (NORCO/VICODIN) 5-325 MG tablet Take 1-2 tablets by mouth every 6 (six) hours as needed for moderate pain or severe pain. 12/12/18   Meyran, Ocie Cornfield, NP  ?ibuprofen (ADVIL) 600 MG tablet Take 1 tablet (600 mg total) by mouth every 6 (six) hours as needed. 03/22/20   Corena Herter, PA-C  ?insulin NPH-regular Human (NOVOLIN 70/30) (70-30) 100  UNIT/ML injection Inject 3 Units into the skin 2 (two) times daily with a meal. ?Patient taking differently: Inject 20 Units into the skin 3 (three) times daily.  11/30/17   Rolland Porter, MD  ?methocarbamol (ROBAXIN) 500 MG tablet Take 1 tablet (500 mg total) by mouth 2 (two) times daily as needed for muscle spasms. 03/22/20   Corena Herter, PA-C  ?   ? ?Allergies    ?Patient has no known allergies.   ? ?Review of Systems   ?Review of Systems  ?Constitutional:  Negative for chills and fever.  ?Respiratory:  Negative for chest tightness and shortness of breath.   ?Cardiovascular:  Negative for chest pain.  ?Gastrointestinal:  Negative for abdominal pain, nausea and vomiting.  ?Genitourinary:  Positive for flank pain. Negative for decreased urine volume, difficulty urinating and dysuria.  ?Musculoskeletal:  Positive for back pain.  ?Skin:  Negative for color change and rash.  ?Neurological:  Positive for numbness (Pins and needle sensation to the left foot). Negative for dizziness and weakness.  ? ?Physical Exam ?Updated Vital Signs ?BP (!) 145/92   Pulse 64   Temp 97.9 ?F (36.6 ?C)   Resp 17   Ht 6\' 3"  (1.905 m)   Wt 113.4 kg   SpO2 95%   BMI 31.25 kg/m?  ?Physical Exam ?Vitals and nursing note reviewed.  ?Constitutional:   ?   General: He is not  in acute distress. ?   Appearance: Normal appearance.  ?   Comments: Patient is uncomfortable appearing  ?Cardiovascular:  ?   Rate and Rhythm: Normal rate and regular rhythm.  ?   Pulses: Normal pulses.  ?Pulmonary:  ?   Effort: Pulmonary effort is normal.  ?Chest:  ?   Chest wall: No tenderness.  ?Abdominal:  ?   Palpations: Abdomen is soft.  ?   Tenderness: There is no abdominal tenderness.  ?Musculoskeletal:  ?   Lumbar back: Tenderness present. No swelling. Positive left straight leg raise test. Negative right straight leg raise test.  ?   Comments: Patient has tenderness to palpation of the left mid to lower lumbar paraspinal muscles, left SI joint and lower  lumbar midline tenderness.  Gross sensation of the bilateral lower extremities intact.  ?Skin: ?   General: Skin is warm.  ?   Capillary Refill: Capillary refill takes less than 2 seconds.  ?   Findings: No rash.  ?Neurological:  ?   General: No focal deficit present.  ?   Mental Status: He is alert.  ?   Sensory: No sensory deficit.  ?   Motor: No weakness.  ? ? ?ED Results / Procedures / Treatments   ?Labs ?(all labs ordered are listed, but only abnormal results are displayed) ?Labs Reviewed  ?URINALYSIS, ROUTINE W REFLEX MICROSCOPIC - Abnormal; Notable for the following components:  ?    Result Value  ? Glucose, UA >=500 (*)   ? All other components within normal limits  ?BASIC METABOLIC PANEL - Abnormal; Notable for the following components:  ? Glucose, Bld 205 (*)   ? All other components within normal limits  ?CBC WITH DIFFERENTIAL/PLATELET  ? ? ?EKG ?None ? ?Radiology ?CT Renal Stone Study ? ?Result Date: 09/27/2021 ?CLINICAL DATA:  Left mid back and flank pain radiating to hip and leg 2 days. EXAM: CT ABDOMEN AND PELVIS WITHOUT CONTRAST TECHNIQUE: Multidetector CT imaging of the abdomen and pelvis was performed following the standard protocol without IV contrast. RADIATION DOSE REDUCTION: This exam was performed according to the departmental dose-optimization program which includes automated exposure control, adjustment of the mA and/or kV according to patient size and/or use of iterative reconstruction technique. COMPARISON:  03/22/2020 FINDINGS: Lower chest: Lung bases are clear. Hepatobiliary: Gallbladder, liver and biliary tree are normal. Pancreas: Normal. Spleen: Normal. Adrenals/Urinary Tract: Adrenal glands are normal. Kidneys are normal in size without hydronephrosis. No nephrolithiasis. Ureters and bladder are normal. Stomach/Bowel: Stomach and small bowel are normal. Previous appendectomy. Mild fecal retention throughout the colon which is otherwise normal. Vascular/Lymphatic: Abdominal aorta is  normal in caliber. No adenopathy. Reproductive: Normal. Other: No free fluid or focal inflammatory change. Musculoskeletal: No acute or significant osseous findings. IMPRESSION: No acute findings in the abdomen/pelvis. Electronically Signed   By: Marin Olp M.D.   On: 09/27/2021 14:40   ? ?Procedures ?Procedures  ? ? ?Medications Ordered in ED ?Medications  ?HYDROmorphone (DILAUDID) injection 1 mg (1 mg Intravenous Given 09/27/21 1346)  ?ondansetron (ZOFRAN) injection 4 mg (4 mg Intravenous Given 09/27/21 1344)  ? ? ?ED Course/ Medical Decision Making/ A&P ?  ?                        ?Medical Decision Making ?Patient here for evaluation of left-sided flank and low back pain.  History of prior kidney infection in lumbar laminectomy 3 years ago.  States that current pain feels similar to  his back pain prior to his surgery.  He denies any dysuria, incontinence or retention.  Paresthesias of the left foot.  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 ? ?On exam, patient is uncomfortable appearing.  Is difficult for him to remain still during exam.  He does have some tenderness of the left lumbar paraspinal muscles and SI joint.  No lower extremity weakness on my exam.  Clinically, I suspect this is related to sciatica.  I have low clinical suspicion that this is related to pyelonephritis or kidney stone. ? ?Amount and/or Complexity of Data Reviewed ?Labs: ordered. ?   Details: Labs interpreted by me, no evidence of leukocytosis or anemia, chemistries mildly elevated blood glucose with unremarkable ion gap.  Urinalysis without evidence of infection. ?Radiology: ordered. ?   Details: CT renal stone study without acute findings of the abdomen or pelvis and no acute or significant osseous findings. ? ?Risk ?Prescription drug management. ? ?Patient here with onset of left flank pain with history of "kidney infection" following day developed pain radiating into SI joint buttocks and down his left leg.  History of prior  lumbar laminectomy 3 years ago.  Current pain feels similar to previous back pain.  He has some paresthesias of his left foot, no abdominal pain, urine or bowel changes.  He is ambulatory.  There is no neurologic deficits

## 2021-09-27 NOTE — ED Notes (Signed)
Patient transported to CT 

## 2021-09-27 NOTE — Discharge Instructions (Signed)
You may alternate ice and heat to your lower back.  You may also try over-the-counter 4% lidocaine patches and use as directed.  Avoid any lifting twisting or bending movements for at least 1 week.  Take the medication as prescribed.  Please monitor your blood sugars closely, if you are sugars become elevated you may need to discontinue the prednisone.  Please follow-up with your primary care providers this week.  Return to emergency department for any new or worsening symptoms. ?

## 2021-09-27 NOTE — ED Triage Notes (Addendum)
Patient c/o left mid back and flank pain that radiates into left hip and leg. Per patient pain started 2 days ago and is progressively getting worse. Denies any known injury. Per patient sharp, shooting pain with numbness in lower leg. Patient able to ambulate with limping noted. Denies any urinary symptoms other than flank pain. Denies any fevers. Per patient taken gabapentin and ibuprofen yesterday with slight relief.   ?

## 2021-10-08 ENCOUNTER — Other Ambulatory Visit: Payer: Self-pay

## 2021-10-08 ENCOUNTER — Emergency Department (HOSPITAL_COMMUNITY)
Admission: EM | Admit: 2021-10-08 | Discharge: 2021-10-08 | Disposition: A | Discharge status: Home / Self Care | Payer: Self-pay | Attending: Emergency Medicine | Admitting: Emergency Medicine

## 2021-10-08 ENCOUNTER — Encounter (HOSPITAL_COMMUNITY): Payer: Self-pay | Admitting: Oncology

## 2021-10-08 ENCOUNTER — Emergency Department (HOSPITAL_COMMUNITY): Payer: Self-pay

## 2021-10-08 DIAGNOSIS — Z794 Long term (current) use of insulin: Secondary | ICD-10-CM | POA: Insufficient documentation

## 2021-10-08 DIAGNOSIS — M5432 Sciatica, left side: Secondary | ICD-10-CM | POA: Insufficient documentation

## 2021-10-08 DIAGNOSIS — E119 Type 2 diabetes mellitus without complications: Secondary | ICD-10-CM | POA: Insufficient documentation

## 2021-10-08 DIAGNOSIS — X58XXXA Exposure to other specified factors, initial encounter: Secondary | ICD-10-CM | POA: Insufficient documentation

## 2021-10-08 DIAGNOSIS — S81802A Unspecified open wound, left lower leg, initial encounter: Secondary | ICD-10-CM | POA: Insufficient documentation

## 2021-10-08 LAB — CBC WITH DIFFERENTIAL/PLATELET
Abs Immature Granulocytes: 0.02 10*3/uL (ref 0.00–0.07)
Basophils Absolute: 0 10*3/uL (ref 0.0–0.1)
Basophils Relative: 0 %
Eosinophils Absolute: 0.1 10*3/uL (ref 0.0–0.5)
Eosinophils Relative: 1 %
HCT: 46.4 % (ref 39.0–52.0)
Hemoglobin: 16.1 g/dL (ref 13.0–17.0)
Immature Granulocytes: 0 %
Lymphocytes Relative: 17 %
Lymphs Abs: 1.9 10*3/uL (ref 0.7–4.0)
MCH: 31.1 pg (ref 26.0–34.0)
MCHC: 34.7 g/dL (ref 30.0–36.0)
MCV: 89.7 fL (ref 80.0–100.0)
Monocytes Absolute: 0.6 10*3/uL (ref 0.1–1.0)
Monocytes Relative: 5 %
Neutro Abs: 8.4 10*3/uL — ABNORMAL HIGH (ref 1.7–7.7)
Neutrophils Relative %: 77 %
Platelets: 251 10*3/uL (ref 150–400)
RBC: 5.17 MIL/uL (ref 4.22–5.81)
RDW: 12.8 % (ref 11.5–15.5)
WBC: 11.1 10*3/uL — ABNORMAL HIGH (ref 4.0–10.5)
nRBC: 0 % (ref 0.0–0.2)

## 2021-10-08 LAB — BASIC METABOLIC PANEL
Anion gap: 8 (ref 5–15)
BUN: 13 mg/dL (ref 6–20)
CO2: 29 mmol/L (ref 22–32)
Calcium: 9.2 mg/dL (ref 8.9–10.3)
Chloride: 104 mmol/L (ref 98–111)
Creatinine, Ser: 1.16 mg/dL (ref 0.61–1.24)
GFR, Estimated: >60 mL/min (ref >60)
Glucose, Bld: 194 mg/dL — ABNORMAL HIGH (ref 70–99)
Potassium: 3.9 mmol/L (ref 3.5–5.1)
Sodium: 141 mmol/L (ref 135–145)

## 2021-10-08 MED ORDER — DOXYCYCLINE HYCLATE 100 MG PO CAPS: 100.0000 mg | ORAL_CAPSULE | Freq: Two times a day (BID) | ORAL | 0 refills | Status: AC

## 2021-10-08 MED ORDER — CEPHALEXIN 500 MG PO CAPS: 500.0000 mg | ORAL_CAPSULE | Freq: Four times a day (QID) | ORAL | 0 refills | Status: AC

## 2021-10-08 MED ORDER — DOXYCYCLINE HYCLATE 100 MG PO TABS
100.0000 mg | ORAL_TABLET | Freq: Once | ORAL | Status: AC
  Administered 2021-10-08: 100 mg via ORAL
  Filled 2021-10-08: qty 1

## 2021-10-08 MED ORDER — KETOROLAC TROMETHAMINE 15 MG/ML IJ SOLN
15.0000 mg | Freq: Once | INTRAMUSCULAR | Status: AC
  Administered 2021-10-08: 15 mg via INTRAMUSCULAR
  Filled 2021-10-08: qty 1

## 2021-10-08 MED ORDER — CEPHALEXIN 500 MG PO CAPS
500.0000 mg | ORAL_CAPSULE | Freq: Once | ORAL | Status: AC
Start: 2021-10-08 — End: 2021-10-08
  Administered 2021-10-08: 500 mg via ORAL
  Filled 2021-10-08: qty 1

## 2021-10-08 NOTE — ED Provider Notes (Signed)
[facility] Provider Note   CSN: [id number] Arrival date & time: 10/08/21  1347     History  Chief Complaint  Patient presents with   Leg Pain    Jesse Lee ReasonMichael Perusse is a 41 y.o. male.  HPI He presents for evaluation of left sided sciatica and poorly healing wound of his right shin which has been present for 10 days.  He conversed with his PCP today by telemetry and was told to come and get it checked out.  He has a history of diabetes.  He reports that he was recently seen and treated for sciatica with prednisone and oxycodone.  He has had previous lower back surgery, and been treated with various things including spinal injections, and various medications.  He denies fever, chills, nausea or vomiting.    Home Medications Prior to Admission medications   Medication Sig Start Date End Date Taking? Authorizing Provider  cephALEXin (KEFLEX) 500 MG capsule Take 1 capsule (500 mg total) by mouth 4 (four) times daily. 10/08/21  Yes Mancel BaleWentz, Kila Godina, MD  doxycycline (VIBRAMYCIN) 100 MG capsule Take 1 capsule (100 mg total) by mouth 2 (two) times daily. One po bid x 7 days 10/08/21  Yes Mancel BaleWentz, Abiageal Blowe, MD  ibuprofen (ADVIL) 600 MG tablet Take 1 tablet (600 mg total) by mouth every 6 (six) hours as needed. 03/22/20   Lorelee NewGreen, Garrett L, PA-C  insulin NPH-regular Human (NOVOLIN 70/30) (70-30) 100 UNIT/ML injection Inject 3 Units into the skin 2 (two) times daily with a meal. Patient taking differently: Inject 35-40 Units into the skin 2 (two) times daily with a meal. 11/30/17   Devoria AlbeKnapp, Iva, MD  lisinopril (ZESTRIL) 10 MG tablet Take 10 mg by mouth daily. 07/06/21   [provider]  lisinopril (ZESTRIL) 10 MG tablet Take 1 tablet (10 mg total) by mouth daily. 09/27/21   Triplett, Tammy, PA-C  methocarbamol (ROBAXIN) 500 MG tablet Take 1 tablet (500 mg total) by mouth 3 (three) times daily. 09/27/21   Triplett, Tammy, PA-C  oxyCODONE-acetaminophen (PERCOCET/ROXICET) 5-325  MG tablet Take 1 tablet by mouth every 4 (four) hours as needed. 09/27/21   Triplett, Tammy, PA-C  predniSONE (DELTASONE) 10 MG tablet Take 6 tablets day one, 5 tablets day two, 4 tablets day three, 3 tablets day four, 2 tablets day five, then 1 tablet day six 09/27/21   Triplett, Tammy, PA-C      Allergies    Patient has no known allergies.    Review of Systems   Review of Systems  Physical Exam Updated Vital Signs BP (!) 151/89 (BP Location: Right Arm)   Pulse 73   Temp (!) 97.4 F (36.3 C) (Oral)   Resp 18   Ht 6\' 3"  (1.905 m)   Wt 116.1 kg   SpO2 98%   BMI 32.00 kg/m  Physical Exam Vitals and nursing note reviewed.  Constitutional:      General: He is not in acute distress.    Appearance: He is well-developed. He is not ill-appearing, toxic-appearing or diaphoretic.  HENT:     Head: Normocephalic and atraumatic.     Right Ear: External ear normal.     Left Ear: External ear normal.  Eyes:     Conjunctiva/sclera: Conjunctivae normal.     Pupils: Pupils are equal, round, and reactive to light.  Neck:     Trachea: Phonation normal.  Cardiovascular:     Rate and Rhythm: Normal rate.  Pulmonary:     Effort: Pulmonary  effort is normal.  Abdominal:     General: There is no distension.     Tenderness: There is no abdominal tenderness.  Musculoskeletal:     Cervical back: Normal range of motion and neck supple.     Comments: Somewhat limited range of motion left lower back and hip secondary to pain.  Mild tenderness palpation left buttock.  Skin:    General: Skin is warm and dry.     Comments: Mid anterior shin with subacute wound, approximately 3 cm x 4.5 cm, with mild eschar, without associated fluctuance, bleeding, or active drainage.  Neurological:     Mental Status: He is alert and oriented to person, place, and time.     Cranial Nerves: No cranial nerve deficit.     Sensory: No sensory deficit.     Motor: No abnormal muscle tone.     Coordination: Coordination  normal.  Psychiatric:        Mood and Affect: Mood normal.        Behavior: Behavior normal.        Thought Content: Thought content normal.        Judgment: Judgment normal.    ED Results / Procedures / Treatments   Labs (all labs ordered are listed, but only abnormal results are displayed) Labs Reviewed  CBC WITH DIFFERENTIAL/PLATELET - Abnormal; Notable for the following components:      Result Value   WBC 11.1 (*)    Neutro Abs 8.4 (*)    All other components within normal limits  BASIC METABOLIC PANEL - Abnormal; Notable for the following components:   Glucose, Bld 194 (*)    All other components within normal limits    EKG None  Radiology DG Tibia/Fibula Right  Result Date: 10/08/2021 CLINICAL DATA:  Evette Cristal wound is not healing. Diabetic. Rule out osteomyelitis. EXAM: RIGHT TIBIA AND FIBULA - 2 VIEW COMPARISON:  None Available. FINDINGS: No acute fracture or dislocation. No osseous destruction. No radiopaque foreign object. Small enthesophyte at the quadriceps insertion. IMPRESSION: No acute osseous abnormality. Electronically Signed   By: Jeronimo Greaves M.D.   On: 10/08/2021 14:41    Procedures Procedures    Medications Ordered in ED Medications  ketorolac (TORADOL) 15 MG/ML injection 15 mg (15 mg Intramuscular Given 10/08/21 1949)  cephALEXin (KEFLEX) capsule 500 mg (500 mg Oral Given 10/08/21 1949)  doxycycline (VIBRA-TABS) tablet 100 mg (100 mg Oral Given 10/08/21 1947)    ED Course/ Medical Decision Making/ A&P                           Medical Decision Making Patient presenting for evaluation of chronic wound, worsening despite symptomatic treatment.  He has been using peroxide on it and was counseled to stop using that.  He also has a secondary complaint of ongoing sciatica which is chronic.  He denies any signs or symptoms of sepsis.  He is able to ambulate therefore is unlikely to have fracture or osteomyelitis.  Problems Addressed: Sciatica of left side:  chronic illness or injury Wound of left lower extremity, initial encounter: complicated acute illness or injury  Amount and/or Complexity of Data Reviewed Independent Historian:     Details: He is a cogent historian Labs: ordered.    Details: CBC, metabolic panel-normal except white count high, glucose high Radiology: ordered and independent interpretation performed.    Details: Right tib-fib-no fracture or osteomyelitis  Risk Prescription drug management. Decision regarding hospitalization. Risk Details:  He has signs and symptoms of poorly healing wound on the shin area.  There is no evidence for osteomyelitis on imaging.  Doubt metabolic disorders or hemodynamic compromise.  No indication for sepsis.  Doubt fracture.  Doubt osteomyelitis.  Stable for discharge with outpatient management using dual therapy antibiotic, Keflex and doxycycline.  Mild elevation of glucose.  He is currently under treatment with insulin.  Doubt unstable metabolic disorder.  He does not require further ED evaluation or hospitalization at this time.           Final Clinical Impression(s) / ED Diagnoses Final diagnoses:  Wound of left lower extremity, initial encounter  Sciatica of left side    Rx / DC Orders ED Discharge Orders          Ordered    cephALEXin (KEFLEX) 500 MG capsule  4 times daily        10/08/21 2032    doxycycline (VIBRAMYCIN) 100 MG capsule  2 times daily        10/08/21 2032              Mancel Bale, MD 10/08/21 2036

## 2021-10-08 NOTE — ED Triage Notes (Signed)
Pt bib PTAR from home d/t b/l LE pain. Pt also has lesion to right leg suspicious for infection per PTAR.

## 2021-10-08 NOTE — Discharge Instructions (Signed)
The wound to your right leg should improve with the antibiotics and wound care.  Use a warm compress on the sore area 3-4 times a day for 30 minutes.  Wash the area well with soap and water at least twice a day then apply a light bandage on it until it heals.  Take the antibiotics that were prescribed, starting tomorrow morning.  Follow-up with a primary care doctor for further care and treatment of the wound and your sciatica.

## 2021-10-08 NOTE — ED Provider Triage Note (Signed)
Emergency Medicine Provider Triage Evaluation Note  Jesse Lee , a 41 y.o. male  was evaluated in triage.  Pt complains of a right shin wound that is not healing.  Patient is diabetic.  Patient is also complaining of left-sided back pain that radiates down the left leg into the toes.  He does report associated numbness in the toes.  No injury or trauma.  This is been ongoing for 2 weeks.  Review of Systems  Positive:  Negative: See above   Physical Exam  BP 123/72 (BP Location: Left Arm)   Pulse 88   Temp 98.1 F (36.7 C) (Oral)   Resp 18   Ht 6\' 3"  (1.905 m)   Wt 116.1 kg   SpO2 97%   BMI 32.00 kg/m  Gen:   Awake, no distress   Resp:  Normal effort  MSK:   Moves extremities without difficulty  Other:  No spinal tenderness over the lumbar spine.  There is point tenderness over the left paralumbar musculature.  Right shin has a wound approximately 5 cm in diameter.  No active purulence at this time.  Medical Decision Making  Medically screening exam initiated at 2:09 PM.  Appropriate orders placed.  was informed that the remainder of the evaluation will be completed by another provider, this initial triage assessment does not replace that evaluation, and the importance of remaining in the ED until their evaluation is complete.     [name] [place], [place] 10/08/21 1410

## 2021-10-12 ENCOUNTER — Other Ambulatory Visit: Payer: Self-pay

## 2021-10-12 ENCOUNTER — Emergency Department (HOSPITAL_COMMUNITY)
Admission: EM | Admit: 2021-10-12 | Discharge: 2021-10-13 | Disposition: A | Discharge status: Home / Self Care | Payer: Self-pay | Attending: Emergency Medicine | Admitting: Emergency Medicine

## 2021-10-12 DIAGNOSIS — R200 Anesthesia of skin: Secondary | ICD-10-CM | POA: Insufficient documentation

## 2021-10-12 DIAGNOSIS — I10 Essential (primary) hypertension: Secondary | ICD-10-CM | POA: Insufficient documentation

## 2021-10-12 DIAGNOSIS — X58XXXA Exposure to other specified factors, initial encounter: Secondary | ICD-10-CM | POA: Insufficient documentation

## 2021-10-12 DIAGNOSIS — Z79899 Other long term (current) drug therapy: Secondary | ICD-10-CM | POA: Insufficient documentation

## 2021-10-12 DIAGNOSIS — S80811A Abrasion, right lower leg, initial encounter: Secondary | ICD-10-CM | POA: Insufficient documentation

## 2021-10-12 DIAGNOSIS — Z794 Long term (current) use of insulin: Secondary | ICD-10-CM | POA: Insufficient documentation

## 2021-10-12 DIAGNOSIS — M5442 Lumbago with sciatica, left side: Secondary | ICD-10-CM | POA: Insufficient documentation

## 2021-10-12 DIAGNOSIS — R531 Weakness: Secondary | ICD-10-CM | POA: Insufficient documentation

## 2021-10-12 DIAGNOSIS — E1165 Type 2 diabetes mellitus with hyperglycemia: Secondary | ICD-10-CM | POA: Insufficient documentation

## 2021-10-12 NOTE — ED Triage Notes (Signed)
Pt here via [facility] from home for back pain and L leg pain x3 weeks. Pt reports pain goes from back down into L leg. Pt was seen recently at [facility] for the same. EMS gave total fentany. CBG 120 172/100, 99% RA, 72HR.

## 2021-10-12 NOTE — ED Provider Triage Note (Signed)
Emergency Medicine Provider Triage Evaluation Note  Jesse Lee , a 41 y.o. male  was evaluated in triage.  Pt complains of back pain x 3 weeks, significantly worse x 4 days. Left sided radiating down the LLE with constant left foot numbness and intermittent LLE paresthesias. Feels like his left leg is weak compared to the right. Denies recent injury. Denies incontinence, saddle anesthesia, IVDU, fever, or urinary sxs. Hx of prior laminectomy. S/p fentanyl w/ EMS  Review of Systems  Per above  Physical Exam  BP (!) 153/105 (BP Location: Right Arm)   Pulse 74   Temp 98.5 F (36.9 C) (Oral)   Resp 18   SpO2 96%  Gen:   Awake, no distress   Resp:  Normal effort  MSK:   Left lumbar paraspinal muscle TTP. Subjective decreased sensation to LLE compared to RLE per patient. RLE 5/5 strength with plantar/dorsiflexion, LLE 4/5 strength.   Medical Decision Making  Medically screening exam initiated at 11:28 PM.  Appropriate orders placed.  Jesse Lee was informed that the remainder of the evaluation will be completed by another provider, this initial triage assessment does not replace that evaluation, and the importance of remaining in the ED until their evaluation is complete.  MRI L spine ordered- discussed w/ attending.   Back pain.    Cherly Anderson, [place] 10/12/21 2331

## 2021-10-13 ENCOUNTER — Emergency Department (HOSPITAL_COMMUNITY): Payer: Self-pay

## 2021-10-13 ENCOUNTER — Encounter (HOSPITAL_COMMUNITY): Payer: Self-pay | Admitting: Student

## 2021-10-13 MED ORDER — NAPROXEN 500 MG PO TABS: 500.0000 mg | ORAL_TABLET | Freq: Two times a day (BID) | ORAL | 0 refills | Status: AC | PRN

## 2021-10-13 MED ORDER — HYDROMORPHONE HCL 1 MG/ML IJ SOLN
1.0000 mg | Freq: Once | INTRAMUSCULAR | Status: AC
  Administered 2021-10-13: 1 mg via INTRAVENOUS
  Filled 2021-10-13: qty 1

## 2021-10-13 MED ORDER — LIDOCAINE 5 % EX PTCH: 1.0000 | MEDICATED_PATCH | Freq: Every day | CUTANEOUS | 0 refills | Status: AC | PRN

## 2021-10-13 MED ORDER — TIZANIDINE HCL 2 MG PO CAPS: 2.0000 mg | ORAL_CAPSULE | Freq: Three times a day (TID) | ORAL | 0 refills | Status: AC | PRN

## 2021-10-13 MED ORDER — GABAPENTIN 400 MG PO CAPS: 400.0000 mg | ORAL_CAPSULE | Freq: Three times a day (TID) | ORAL | 0 refills | Status: AC

## 2021-10-13 MED ORDER — KETOROLAC TROMETHAMINE 15 MG/ML IJ SOLN
15.0000 mg | Freq: Once | INTRAMUSCULAR | Status: AC
  Administered 2021-10-13: 15 mg via INTRAVENOUS
  Filled 2021-10-13: qty 1

## 2021-10-13 MED ORDER — LIDOCAINE 5 % EX PTCH
2.0000 | MEDICATED_PATCH | CUTANEOUS | Status: DC
  Administered 2021-10-13: 2 via TRANSDERMAL
  Filled 2021-10-13: qty 2

## 2021-10-13 MED ORDER — HYDROMORPHONE HCL 1 MG/ML IJ SOLN
0.5000 mg | Freq: Once | INTRAMUSCULAR | Status: AC
  Administered 2021-10-13: 0.5 mg via INTRAVENOUS
  Filled 2021-10-13: qty 1

## 2021-10-13 NOTE — Discharge Instructions (Addendum)
You were seen in the emergency department today for back pain and sciatica.  Your MRI showed scar tissue formation versus a disc problem as well as some narrowing to your left side which may be contributing to your symptoms.  We are sending home with the following medicines to help with your pain: Lidoderm patches, naproxen, Zanaflex, and gabapentin.  Please take these as prescribed.  Do not drive or operate heavy machinery when taking Zanaflex or gabapentin as these can make you sleepy.  Do not take these with other sedating medicines or with alcohol/drugs.  Take naproxen with food as it can cause stomach upset and or stomach bleeding, do not take other NSAIDs with this.  Only take medication prescribed to you or your safety.  We have prescribed you new medication(s) today. Discuss the medications prescribed today with your pharmacist as they can have adverse effects and interactions with your other medicines including over the counter and prescribed medications. Seek medical evaluation if you start to experience new or abnormal symptoms after taking one of these medicines, seek care immediately if you start to experience difficulty breathing, feeling of your throat closing, facial swelling, or rash as these could be indications of a more serious allergic reaction   Please call your neurosurgery office today to schedule soonest available follow-up appointment.  Return to the emergency department for new or worsening symptoms including but not limited to new or worsening pain, inability to walk, worsening numbness/weakness, foot dragging, or any other concerns.

## 2021-10-13 NOTE — ED Provider Notes (Signed)
[facility] EMERGENCY DEPARTMENT Provider Note   CSN: [id number] Arrival date & time: 10/12/21  2258     History  Chief Complaint  Patient presents with   Back Pain   Leg Pain    Jesse Lee is a 41 y.o. male with a hx of hypertension, GERD, diabetes mellitus, and prior lumbar laminectomy who presents to the ED with complaints of back pain x 3 weeks significantly worsened x 4 days. Pain is left sided radiating down the LLE with constant left foot numbness and intermittent LLE paresthesias. Feels like his left leg is weak compared to the right. Denies recent injury. Denies incontinence, saddle anesthesia, IVDU, fever, or urinary sxs. Hx of prior laminectomy. S/p fentanyl w/ EMS with some mild improvement temporarily .   Seen in the ED for somewhat similar pain 09/27/2021, was prescribed a short course of narcotic medications as well as prednisone, he states he only took 2 days of the prednisone as it made his blood sugar abnormal, the OxyContin did help some when taken with muscle relaxers and gabapentin.  He states that he has been taking his sisters gabapentin 300 mg 3 times daily, he states that he previously took gabapentin at a much higher dose in the past.  Gabapentin does take the edge off somewhat.  HPI     Home Medications Prior to Admission medications   Medication Sig Start Date End Date Taking? Authorizing Provider  cephALEXin (KEFLEX) 500 MG capsule Take 1 capsule (500 mg total) by mouth 4 (four) times daily. 10/08/21   Daleen Bo, MD  doxycycline (VIBRAMYCIN) 100 MG capsule Take 1 capsule (100 mg total) by mouth 2 (two) times daily. One po bid x 7 days 10/08/21   Daleen Bo, MD  ibuprofen (ADVIL) 600 MG tablet Take 1 tablet (600 mg total) by mouth every 6 (six) hours as needed. 03/22/20   Corena Herter, PA-C  insulin NPH-regular Human (NOVOLIN 70/30) (70-30) 100 UNIT/ML injection Inject 3 Units into the skin 2 (two) times daily with a  meal. Patient taking differently: Inject 35-40 Units into the skin 2 (two) times daily with a meal. 11/30/17   Rolland Porter, MD  lisinopril (ZESTRIL) 10 MG tablet Take 10 mg by mouth daily. 07/06/21   [provider]  lisinopril (ZESTRIL) 10 MG tablet Take 1 tablet (10 mg total) by mouth daily. 09/27/21   Triplett, Tammy, PA-C  methocarbamol (ROBAXIN) 500 MG tablet Take 1 tablet (500 mg total) by mouth 3 (three) times daily. 09/27/21   Triplett, Tammy, PA-C  oxyCODONE-acetaminophen (PERCOCET/ROXICET) 5-325 MG tablet Take 1 tablet by mouth every 4 (four) hours as needed. 09/27/21   Triplett, Tammy, PA-C  predniSONE (DELTASONE) 10 MG tablet Take 6 tablets day one, 5 tablets day two, 4 tablets day three, 3 tablets day four, 2 tablets day five, then 1 tablet day six 09/27/21   Triplett, Tammy, PA-C      Allergies    Patient has no known allergies.    Review of Systems   Review of Systems  Constitutional:  Negative for chills, fever and unexpected weight change.  Gastrointestinal:  Negative for abdominal pain, nausea and vomiting.  Genitourinary:  Negative for dysuria.  Musculoskeletal:  Positive for back pain.  Neurological:  Positive for weakness and numbness.       Negative for saddle anesthesia or bowel/bladder incontinence.    Physical Exam Updated Vital Signs BP (!) 153/105 (BP Location: Right Arm)   Pulse 74   Temp  98.5 F (36.9 C) (Oral)   Resp 18   SpO2 96%  Physical Exam Vitals and nursing note reviewed.  Constitutional:      General: He is not in acute distress.    Appearance: He is well-developed. He is not toxic-appearing.  HENT:     Head: Normocephalic and atraumatic.  Eyes:     General:        Right eye: No discharge.        Left eye: No discharge.     Conjunctiva/sclera: Conjunctivae normal.  Cardiovascular:     Rate and Rhythm: Normal rate and regular rhythm.     Comments: 2+ DP pulses. Pulmonary:     Effort: No respiratory distress.     Breath sounds: Normal  breath sounds. No wheezing or rales.  Abdominal:     General: There is no distension.     Palpations: Abdomen is soft.     Tenderness: There is no abdominal tenderness.  Musculoskeletal:     Cervical back: Neck supple.     Comments: Back: Left lumbar paraspinal muscle tenderness to palpation extending to the gluteal region.  Left SI joint tenderness palpation.  No point/focal midline tenderness. Lower extremities: Abrasion to the right anterior lower leg without surrounding erythema or purulent drainage.  No fluctuance or induration.  Patient has intact range of motion throughout.  No significant tenderness to palpation throughout the left lower extremity compartments.  Skin:    General: Skin is warm and dry.  Neurological:     Mental Status: He is alert.     Comments: Clear speech.  Patient reports subjective decrease sensation to the left lower extremity compared to the right lower extremity.  5 out of 5 strength with right lower extremity plantar/dorsi flexion, 4 out of 5 strength with left lower extremity plantar/dorsiflexion, symmetric 5/5 strength with knee flexion/extension. No drop foot, antalgic gait. Marland Kitchen    Psychiatric:        Behavior: Behavior normal.    ED Results / Procedures / Treatments   Labs (all labs ordered are listed, but only abnormal results are displayed) Labs Reviewed - No data to display  EKG None  Radiology MR LUMBAR SPINE WO CONTRAST  Result Date: 10/13/2021 CLINICAL DATA:  Lumbar radiculopathy. Prior back surgery with new symptoms. Left leg pain for 3 weeks EXAM: MRI LUMBAR SPINE WITHOUT CONTRAST TECHNIQUE: Multiplanar, multisequence MR imaging of the lumbar spine was performed. No intravenous contrast was administered. COMPARISON:  03/14/2019 FINDINGS: Segmentation:  Transitional S1 vertebra as previously numbered Alignment:  Physiologic. Vertebrae:  No fracture, evidence of discitis, or bone lesion. Conus medullaris and cauda equina: Conus extends to the L1  level. Conus and cauda equina appear normal. Paraspinal and other soft tissues: Expected postoperative scarring in the lower left back. Disc levels: L4-L5: Mild facet spurring L5-S1:Disc narrowing and bulging with left foraminal extrusion appearance compressing the left L5 nerve root. Mild facet spurring. 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 This is a postoperative area with fibrosis seen previously, but there appears to be a discrete herniation which could be confirmed with postcontrast imaging. Transitional S1 vertebra. Electronically Signed   By: Jorje Guild M.D.   On: 10/13/2021 05:35    Procedures Procedures    Medications Ordered in ED Medications - No data to display  ED Course/ Medical Decision Making/ A&P  Medical Decision Making Amount and/or Complexity of Data Reviewed Radiology: ordered.  Risk Prescription drug management.  Patient presents to the emergency department with left-sided back pain radiating down the left lower extremity with associated paresthesias/numbness and weakness.  On exam patient does have subjective decrease sensation as well as some mild weakness to the left lower extremity compared to the right lower extremity.  Afebrile, no history of IVDU, low suspicion for epidural abscess.Given patient's progressively worsening pain with neurologic changes we will obtain MRI L-spine without contrast for further assessment.  I have ordered Dilaudid and Lidoderm patches for pain.  Chart/nursing note reviewed for additional history. Reviewed CT renal stone study report from 09/27/21, musculoskeletal report states no acute or significant osseous findings.  Most recent GFR greater than 60.  I ordered and viewed imaging below, agree with radiologist: MRI lumbar spine without contrast:  Findings of L5-S1 left foraminal extrusion and L5 impingement. This is a postoperative area with fibrosis seen previously, but  there appears to be a discrete herniation which could be confirmed with postcontrast imaging. Transitional S1 vertebra.  06:00: CONSULT: Discussed with FNP [facility] Peers neurosurgery, difficult to determine scar tissue vs. Discrete herniation without contrast, relays that this would not change current plan- recommends discharge with outpatient follow up with supportive care. We discussed medication management options, deferring steroids given patient's DM and hyperglycemia when these were tried recently, will provide prescription for gabapentin increased from what he has been taking that is his sisters (he will also be educated regarding this being a safety issue) with additional supportive medicines- NSAIDs, alternative muscle relaxant, lidoderm patches. Close outpatient clinic follow up. Able to ambulate in the ED.   I discussed results, treatment plan, need for follow-up, and return precautions with the patient. Provided opportunity for questions, patient confirmed understanding and is in agreement with plan.   Findings and plan of care discussed with supervising physician Dr. Ralene Bathe who is in agreement.   Final Clinical Impression(s) / ED Diagnoses Final diagnoses:  Acute left-sided low back pain with left-sided sciatica    Rx / DC Orders ED Discharge Orders          Ordered    gabapentin (NEURONTIN) 400 MG capsule  3 times daily        10/13/21 0626    tizanidine (ZANAFLEX) 2 MG capsule  3 times daily PRN        10/13/21 0626    naproxen (NAPROSYN) 500 MG tablet  2 times daily PRN        10/13/21 0626    lidocaine (LIDODERM) 5 %  Daily PRN        10/13/21 0626              Amaryllis Dyke, PA-C 10/13/21 [id number]    Quintella Reichert, MD 10/13/21 [phone]
